# Patient Record
Sex: Female | Born: 1975 | Race: White | Hispanic: No | Marital: Single | Smoking: Current every day smoker
Health system: Southern US, Community
[De-identification: ages and names within clinical notes are randomized; demographics above are authoritative.]

## PROBLEM LIST (undated history)

## (undated) DIAGNOSIS — N390 Urinary tract infection, site not specified: Secondary | ICD-10-CM

## (undated) DIAGNOSIS — N3010 Interstitial cystitis (chronic) without hematuria: Secondary | ICD-10-CM

## (undated) DIAGNOSIS — N12 Tubulo-interstitial nephritis, not specified as acute or chronic: Secondary | ICD-10-CM

## (undated) DIAGNOSIS — N200 Calculus of kidney: Secondary | ICD-10-CM

## (undated) HISTORY — PX: APPENDECTOMY: SHX54

---

## 1997-09-17 ENCOUNTER — Emergency Department (HOSPITAL_COMMUNITY): Admission: EM | Admit: 1997-09-17 | Discharge: 1997-09-17 | Payer: Self-pay | Admitting: Emergency Medicine

## 1998-07-27 ENCOUNTER — Emergency Department (HOSPITAL_COMMUNITY): Admission: EM | Admit: 1998-07-27 | Discharge: 1998-07-27 | Payer: Self-pay | Admitting: Emergency Medicine

## 1998-07-28 ENCOUNTER — Emergency Department (HOSPITAL_COMMUNITY): Admission: EM | Admit: 1998-07-28 | Discharge: 1998-07-29 | Payer: Self-pay | Admitting: Emergency Medicine

## 1998-07-28 ENCOUNTER — Encounter: Payer: Self-pay | Admitting: Emergency Medicine

## 1998-07-29 ENCOUNTER — Encounter: Payer: Self-pay | Admitting: Emergency Medicine

## 1998-08-09 ENCOUNTER — Emergency Department (HOSPITAL_COMMUNITY): Admission: EM | Admit: 1998-08-09 | Discharge: 1998-08-09 | Payer: Self-pay | Admitting: Emergency Medicine

## 1998-09-28 ENCOUNTER — Ambulatory Visit (HOSPITAL_COMMUNITY): Admission: RE | Admit: 1998-09-28 | Discharge: 1998-09-28 | Payer: Self-pay | Admitting: *Deleted

## 1998-09-28 ENCOUNTER — Encounter: Payer: Self-pay | Admitting: *Deleted

## 1998-10-05 ENCOUNTER — Encounter: Admission: RE | Admit: 1998-10-05 | Discharge: 1998-10-05 | Payer: Self-pay | Admitting: Internal Medicine

## 1998-11-05 ENCOUNTER — Inpatient Hospital Stay (HOSPITAL_COMMUNITY): Admission: AD | Admit: 1998-11-05 | Discharge: 1998-11-05 | Payer: Self-pay | Admitting: *Deleted

## 1998-11-06 ENCOUNTER — Encounter: Payer: Self-pay | Admitting: *Deleted

## 1999-01-03 ENCOUNTER — Inpatient Hospital Stay (HOSPITAL_COMMUNITY): Admission: AD | Admit: 1999-01-03 | Discharge: 1999-01-03 | Payer: Self-pay | Admitting: *Deleted

## 1999-01-26 ENCOUNTER — Ambulatory Visit (HOSPITAL_COMMUNITY): Admission: RE | Admit: 1999-01-26 | Discharge: 1999-01-26 | Payer: Self-pay | Admitting: *Deleted

## 1999-01-26 ENCOUNTER — Encounter: Payer: Self-pay | Admitting: *Deleted

## 1999-04-04 ENCOUNTER — Inpatient Hospital Stay (HOSPITAL_COMMUNITY): Admission: AD | Admit: 1999-04-04 | Discharge: 1999-04-04 | Payer: Self-pay | Admitting: *Deleted

## 1999-05-20 ENCOUNTER — Inpatient Hospital Stay (HOSPITAL_COMMUNITY): Admission: AD | Admit: 1999-05-20 | Discharge: 1999-05-23 | Payer: Self-pay | Admitting: *Deleted

## 1999-05-20 ENCOUNTER — Encounter: Payer: Self-pay | Admitting: Obstetrics

## 1999-05-20 ENCOUNTER — Encounter (INDEPENDENT_AMBULATORY_CARE_PROVIDER_SITE_OTHER): Payer: Self-pay | Admitting: *Deleted

## 1999-05-22 ENCOUNTER — Encounter: Payer: Self-pay | Admitting: *Deleted

## 1999-05-22 ENCOUNTER — Encounter: Payer: Self-pay | Admitting: Obstetrics

## 2000-11-14 ENCOUNTER — Emergency Department (HOSPITAL_COMMUNITY): Admission: EM | Admit: 2000-11-14 | Discharge: 2000-11-14 | Payer: Self-pay | Admitting: Emergency Medicine

## 2000-11-14 ENCOUNTER — Encounter: Payer: Self-pay | Admitting: Emergency Medicine

## 2000-11-17 ENCOUNTER — Emergency Department (HOSPITAL_COMMUNITY): Admission: EM | Admit: 2000-11-17 | Discharge: 2000-11-17 | Payer: Self-pay | Admitting: Emergency Medicine

## 2000-11-17 ENCOUNTER — Encounter: Payer: Self-pay | Admitting: Emergency Medicine

## 2001-08-05 ENCOUNTER — Encounter: Payer: Self-pay | Admitting: Emergency Medicine

## 2001-08-05 ENCOUNTER — Emergency Department (HOSPITAL_COMMUNITY): Admission: EM | Admit: 2001-08-05 | Discharge: 2001-08-05 | Payer: Self-pay | Admitting: Emergency Medicine

## 2001-10-04 ENCOUNTER — Emergency Department (HOSPITAL_COMMUNITY): Admission: EM | Admit: 2001-10-04 | Discharge: 2001-10-04 | Payer: Self-pay | Admitting: Emergency Medicine

## 2001-11-26 ENCOUNTER — Encounter: Payer: Self-pay | Admitting: Emergency Medicine

## 2001-11-26 ENCOUNTER — Emergency Department (HOSPITAL_COMMUNITY): Admission: EM | Admit: 2001-11-26 | Discharge: 2001-11-26 | Payer: Self-pay

## 2002-03-07 ENCOUNTER — Inpatient Hospital Stay (HOSPITAL_COMMUNITY): Admission: AD | Admit: 2002-03-07 | Discharge: 2002-03-07 | Payer: Self-pay | Admitting: Family Medicine

## 2002-04-08 ENCOUNTER — Other Ambulatory Visit: Admission: RE | Admit: 2002-04-08 | Discharge: 2002-04-08 | Payer: Self-pay | Admitting: Obstetrics and Gynecology

## 2002-04-25 ENCOUNTER — Encounter: Payer: Self-pay | Admitting: Obstetrics and Gynecology

## 2002-04-25 ENCOUNTER — Inpatient Hospital Stay (HOSPITAL_COMMUNITY): Admission: AD | Admit: 2002-04-25 | Discharge: 2002-04-25 | Payer: Self-pay | Admitting: Obstetrics and Gynecology

## 2002-04-27 ENCOUNTER — Ambulatory Visit (HOSPITAL_COMMUNITY): Admission: AD | Admit: 2002-04-27 | Discharge: 2002-04-27 | Payer: Self-pay | Admitting: Obstetrics and Gynecology

## 2002-04-27 ENCOUNTER — Encounter (INDEPENDENT_AMBULATORY_CARE_PROVIDER_SITE_OTHER): Payer: Self-pay | Admitting: Specialist

## 2002-10-06 ENCOUNTER — Emergency Department (HOSPITAL_COMMUNITY): Admission: EM | Admit: 2002-10-06 | Discharge: 2002-10-06 | Payer: Self-pay | Admitting: Emergency Medicine

## 2002-11-19 ENCOUNTER — Emergency Department (HOSPITAL_COMMUNITY): Admission: EM | Admit: 2002-11-19 | Discharge: 2002-11-19 | Payer: Self-pay | Admitting: *Deleted

## 2002-11-19 ENCOUNTER — Encounter: Payer: Self-pay | Admitting: Emergency Medicine

## 2002-12-09 ENCOUNTER — Encounter: Admission: RE | Admit: 2002-12-09 | Discharge: 2002-12-09 | Payer: Self-pay | Admitting: Internal Medicine

## 2003-08-24 ENCOUNTER — Ambulatory Visit (HOSPITAL_COMMUNITY): Admission: RE | Admit: 2003-08-24 | Discharge: 2003-08-24 | Payer: Self-pay | Admitting: Obstetrics and Gynecology

## 2003-10-15 ENCOUNTER — Other Ambulatory Visit: Admission: RE | Admit: 2003-10-15 | Discharge: 2003-10-15 | Payer: Self-pay | Admitting: Obstetrics and Gynecology

## 2003-12-18 ENCOUNTER — Inpatient Hospital Stay (HOSPITAL_COMMUNITY): Admission: AD | Admit: 2003-12-18 | Discharge: 2003-12-18 | Payer: Self-pay | Admitting: Obstetrics and Gynecology

## 2004-03-03 ENCOUNTER — Inpatient Hospital Stay (HOSPITAL_COMMUNITY): Admission: AD | Admit: 2004-03-03 | Discharge: 2004-03-03 | Payer: Self-pay | Admitting: Obstetrics and Gynecology

## 2004-03-20 ENCOUNTER — Inpatient Hospital Stay (HOSPITAL_COMMUNITY): Admission: AD | Admit: 2004-03-20 | Discharge: 2004-03-20 | Payer: Self-pay | Admitting: Obstetrics and Gynecology

## 2004-04-07 ENCOUNTER — Inpatient Hospital Stay (HOSPITAL_COMMUNITY): Admission: RE | Admit: 2004-04-07 | Discharge: 2004-04-09 | Payer: Self-pay | Admitting: Obstetrics and Gynecology

## 2004-05-18 ENCOUNTER — Other Ambulatory Visit: Admission: RE | Admit: 2004-05-18 | Discharge: 2004-05-18 | Payer: Self-pay | Admitting: Obstetrics and Gynecology

## 2004-12-02 ENCOUNTER — Ambulatory Visit (HOSPITAL_COMMUNITY): Admission: RE | Admit: 2004-12-02 | Discharge: 2004-12-02 | Payer: Self-pay | Admitting: Obstetrics and Gynecology

## 2004-12-19 ENCOUNTER — Ambulatory Visit (HOSPITAL_COMMUNITY): Admission: RE | Admit: 2004-12-19 | Discharge: 2004-12-19 | Payer: Self-pay | Admitting: Obstetrics and Gynecology

## 2004-12-19 ENCOUNTER — Encounter (INDEPENDENT_AMBULATORY_CARE_PROVIDER_SITE_OTHER): Payer: Self-pay | Admitting: *Deleted

## 2006-06-12 ENCOUNTER — Emergency Department (HOSPITAL_COMMUNITY): Admission: EM | Admit: 2006-06-12 | Discharge: 2006-06-12 | Payer: Self-pay | Admitting: Emergency Medicine

## 2006-10-07 ENCOUNTER — Emergency Department (HOSPITAL_COMMUNITY): Admission: EM | Admit: 2006-10-07 | Discharge: 2006-10-07 | Payer: Self-pay | Admitting: Emergency Medicine

## 2007-06-28 ENCOUNTER — Emergency Department (HOSPITAL_COMMUNITY): Admission: EM | Admit: 2007-06-28 | Discharge: 2007-06-28 | Payer: Self-pay | Admitting: Emergency Medicine

## 2007-09-30 ENCOUNTER — Emergency Department (HOSPITAL_COMMUNITY): Admission: EM | Admit: 2007-09-30 | Discharge: 2007-09-30 | Payer: Self-pay | Admitting: Emergency Medicine

## 2008-03-16 ENCOUNTER — Emergency Department (HOSPITAL_COMMUNITY): Admission: EM | Admit: 2008-03-16 | Discharge: 2008-03-17 | Payer: Self-pay | Admitting: Emergency Medicine

## 2009-10-16 ENCOUNTER — Emergency Department (HOSPITAL_COMMUNITY): Admission: EM | Admit: 2009-10-16 | Discharge: 2009-10-16 | Payer: Self-pay | Admitting: Emergency Medicine

## 2010-10-07 NOTE — H&P (Signed)
Tammy Carlson, Tammy Carlson             ACCOUNT NO.:  [id number]   MEDICAL RECORD NO.:  [id number]           PATIENT TYPE:   LOCATION:                                 FACILITY:   PHYSICIAN:  Rudy Jew. Ashley Royalty, M.D.     DATE OF BIRTH:   DATE OF ADMISSION:  12/19/2004  DATE OF DISCHARGE:                                HISTORY & PHYSICAL   This is a 35 year old gravida 4, para 1-2-1-2 (had a child born with  leukemia who died at 71 months of age).  She presented to me December 01, 2004  complaining of right lower quadrant and back pain.  She states she went to  [facility] the day prior to presenting to my office and was evaluated for  same.  A urinalysis at that location was negative as well as a CBC.  She  denies having associated fever, nausea, or vomiting.  She has a Mirena IUD  in place which she desires to have removed.  She also states a desire for  attempt at permanent surgical sterilization.  She is status post  appendectomy many years ago.  She had an ultrasound performed December 02, 2004  which was negative.  Her pain persists and is located mostly on the right  side.  She is requiring narcotic analgesics in order to function normally  and requests surgical intervention for same.  Hence, she requests  diagnostic/operative laparoscopy with laparoscopic tubal sterilization  procedure as well.   MEDICATIONS:  None.   PAST MEDICAL HISTORY:   MEDICAL:  Negative.   SURGICAL:  1.  Cesarean section x2.  2.  Appendectomy.  3.  Orthopedic surgery on foot.   ALLERGIES:  None.   FAMILY HISTORY:  Positive for hypertension and diabetes.   SOCIAL HISTORY:  Patient smokes approximately one-half pack of cigarettes  per day.  She denies significant use of alcohol.   REVIEW OF SYSTEMS:  Noncontributory.   PHYSICAL EXAMINATION:  GENERAL:  Well-developed, well-nourished, pleasant  female in no acute distress.  VITAL SIGNS:  Afebrile.  Vital signs stable.  SKIN:  Warm and dry without lesions.  LYMPH:  There is no supraclavicular, cervical, or inguinal adenopathy.  CHEST:  Lungs are clear.  CARDIAC:  Regular rate and rhythm.  ABDOMEN:  Soft and nontender without masses or organomegaly.  Bowel sounds  are active.  PELVIC:  External genitalia within normal limits.  Vagina and cervix are  without gross lesions.  Bimanual examination reveals uterus to be  approximately 8 x 4 x 4 cm.  No adnexal masses are palpable.   IMPRESSION:  1.  Pelvic pain.  Rule out adhesions, endometriosis, etc.  2.  Desire for attempt at permanent surgical sterilization.  3.  Status post appendectomy.   PLAN:  Diagnostic/operative laparoscopy, laparoscopic tubal sterilization  procedure.  Risks, benefits, complications, and alternatives fully discussed  with the patient.  Permanency and failure rate of various tubal  sterilization procedures discussed and accepted.  Possibility of mini  laparotomy with partial salpingectomy discussed and accepted.  Possible use  of the laser discussed and accepted.  Possible  need for unilateral salpingo-  oophorectomy discussed and accepted.  Possible need for exploratory  laparotomy discussed and accepted.  Questions invited and answered.  Patient  also requests removal of the IUD in the same setting.   Please note a portion of the evaluation for this document was performed in  the office setting.       JAM/MEDQ  D:  12/19/2004  T:  12/19/2004  Job:  [id number]

## 2010-10-07 NOTE — Op Note (Signed)
Tammy Carlson, Tammy Carlson             ACCOUNT NO.:  [id number]   MEDICAL RECORD NO.:  [id number]          PATIENT TYPE:  AMB   LOCATION:  SDC                           [facility]   PHYSICIAN:  James A. Ashley Royalty, M.D.DATE OF BIRTH:  [birth year]   DATE OF PROCEDURE:  12/19/2004  DATE OF DISCHARGE:                                 OPERATIVE REPORT   PREOPERATIVE DIAGNOSIS:  1.  Pelvic pain - rule out adhesions, endometriosis.  2.  Desire for attempted permanent surgical sterilization.   POSTOPERATIVE DIAGNOSIS:  1.  Pelvic pain - rule out adhesions, endometriosis, with suspected      endometriosis. Path pending.  2.  Pelvic adhesions.   PROCEDURE:  1.  Diagnostic/operative laparoscopy.  2.  Bilateral tubal sterilization procedure (Falope rings)  3.  Lysis of adhesions.  4.  Biopsy of the anterior cul-de-sac.  5.  Fulguration of endometriosis.   SURGEON:  Rudy Jew. Ashley Royalty, M.D.   ANESTHESIA:  General.   ESTIMATED BLOOD LOSS:  Less than 25 mL.   COMPLICATIONS:  None.   PACKS AND DRAINS:  None.   PROCEDURE:  The patient was taken to the operating room and placed in the  dorsosupine position. After general anesthesia was administered. She was  placed in the lithotomy position, prepped and draped in usual manner for  abdominal vaginal surgery. Posterior weighted retractor was placed per  vagina. Anterior lip of cervix grasped with single-tooth tenaculum. Jarcho  uterine manipulator was placed per cervix. The IUD string was not  immediately visualized. It was determined it would be removed later in the  office. Jarcho uterine manipulator was placed per cervix and held in place  with tenaculum. The bladder was drained with a red rubber catheter. Next a  1.5 cm infraumbilical incision was made in the longitudinal plane. Veress  needle was inserted into the abdominal cavity. Its location was verified by  instillation saline in hanging drop techniques. 3 liters of CO2 were  instilled at 1 liter per minute to create a good pneumoperitoneum. Next a  size 10/11 disposable laparoscopic trocar was placed in the abdominal  cavity. Its location was verified by placement laparoscope. There were no  signs of trauma. Pneumoperitoneum was maintained throughout with CO2. Next a  5 mm suprapubic port was placed through the patient's old laparotomy  incision in the midline. Correct visualization and transillumination  techniques were employed. The pelvis was then thoroughly inspected.  Immediately upon visualizing the pelvis an omental adhesion was noted from  the omentum to the anterior abdominal wall just the left of the midline and  approximately 1/3 of the way from the umbilicus to the symphysis pubis.  Appropriate photos were obtained. Bipolar cautery was employed to coagulate  and then scissors were used to lyse the adhesions successfully. Next the  pelvis was inspected more thoroughly. The uterus was normal size, shape and  contour without evidence of any fibroids or endometriosis. The left and  right fallopian tubes were normal size, shape, contour and length with  luxuriant fimbria. The right ovary slightly enlarged and had some pigmented  lesions consistent  with but not diagnostic of endometriosis. Left ovary had  similar pigmented lesions consistent with but not diagnostic of  endometriosis. It was normal size, shape and contour. There were several  pigmented lesions on the parietal peritoneum consistent with endometriosis  one in particular in the anterior cul-de-sac was chosen for biopsy and was  successfully biopsied and submitted to pathology for histologic studies.  Attempt was made to biopsy the pigmented lesion of the left ovary.  However,  it appeared after the biopsy was completed no tissue could really be  successfully identified and submitted for histologic studies. Additional  adhesions were noted from the right colon to the right pelvic sidewall and   also up higher in the right mid quadrant to the right pelvic sidewall. These  were filmy and were easily lysed without difficulty. The remaining areas of  endometriosis were fulgurated with the bipolar cautery.   Attention was then turned to tubal sterilization procedure. The right  fallopian tube was grasped, traced to its fimbriated end. Avascular area was  chosen. The distal isthmic to proximal ampullary region for Falope ring  placement. A Falope ring was applied without difficulty. Excellent blanching  of tissue was noted. An excellent knuckle of tube was noted to be contained  within the ring. Appropriate photo was obtained. Next the left fallopian  tube was grasped, traced to its fimbriated end. Avascular area in the distal  isthmic portion was chosen for ring placement. Falope ring was applied  without difficulty. Excellent knuckle of tube was noted to be contained  within the ring. Excellent blanching of tissue was noted. Should be  mentioned that 8 mm trocar was placed in the previous midline position in  order to accomplish Falope ring placement. In addition, a 5 mm suprapubic  port was placed in the left lower quadrant using transillumination and  direct visualization techniques in order to accomplish the biopsies.   At this point the patient was felt to have benefited maximally from the  surgical procedure. The abdominal instruments were removed and  pneumoperitoneum evacuated. Fascial defects were closed with 0 Vicryl in a  interrupted fashion. The superior skin incision was closed with 3-0 Monocryl  in a subcuticular fashion. The inferior skin incisions were closed with  Dermabond. The vaginal instruments were removed. Hemostasis noted. The  procedure terminated.   The patient tolerated the procedure extremely well and was returned to the  recovery room in good condition.       JAM/MEDQ  D:  12/19/2004  T:  12/19/2004  Job:  [id number]

## 2010-10-07 NOTE — H&P (Signed)
[name], Tammy Carlson             ACCOUNT NO.:  [id number]   MEDICAL RECORD NO.:  [id number]          PATIENT TYPE:  INP   LOCATION:  NA                            [facility]   PHYSICIAN:  Tammy Carlson, M.D.DATE OF BIRTH:  1975/10/27   DATE OF ADMISSION:  04/07/2004  DATE OF DISCHARGE:                                HISTORY & PHYSICAL   This is a 35 year old, gravida 5, para 1-1-2-1, [facility] April 16, 2004,  approximately [redacted] weeks gestation.  Prenatal care is complicated by a history  of previous cesarean section x2 as well as smoking.  She requests repeat  cesarean section.   MEDICATIONS:  Vitamins.   PAST MEDICAL HISTORY:  Mitral valve prolapse.   PAST SURGICAL HISTORY:  As above plus foot surgery, appendectomy.   ALLERGIES:  None.   FAMILY HISTORY:  Noncontributory.   SOCIAL HISTORY:  Noncontributory.   PHYSICAL EXAMINATION:  GENERAL:  Well-developed, well-nourished, pleasant  female in no acute distress.  VITAL SIGNS:  Afebrile, vital signs stable.  SKIN:  Warm and dry without lesions.  LYMPH:  There is no supraclavicular, cervical or inguinal adenopathy.  HEENT:  Normocephalic.  NECK:  Supple without thyromegaly.  CHEST:  Lungs are clear.  CARDIAC:  Regular rate and rhythm without murmur, gallop or rub.  BREAST:  Deferred.  ABDOMEN:  Gravid with a term fundal height.  Fetal heart tones are  auscultated with a Doppler.  MUSCULOSKELETAL:  No CVA tenderness.  PELVIC:  Deferred.   IMPRESSION:  1.  Intrauterine pregnancy at approximately [redacted] weeks gestation.  2.  Previous cesarean section.  3.  Smoker.   PLAN:  Repeat cesarean section. The risks, benefits, complications and  alternatives were discussed with the patient. She states she understands and  accepts.  Questions were invited and answered.      JAM/MEDQ  D:  04/06/2004  T:  04/07/2004  Job:  [id number]

## 2010-10-07 NOTE — H&P (Signed)
   NAME:  Tammy Carlson, Tammy Carlson                       ACCOUNT NO.:  [id number]   MEDICAL RECORD NO.:  [id number]                   PATIENT TYPE:  AMB   LOCATION:  SDC                                  [facility]   PHYSICIAN:  Ronda Fairly. Galen Daft, M.D.              DATE OF BIRTH:  April 29, 1976   DATE OF ADMISSION:  04/27/2002  DATE OF DISCHARGE:                                HISTORY & PHYSICAL   CHIEF COMPLAINT:  Abdominal pain.   HISTORY OF PRESENT ILLNESS:  The patient is a 35 year old Gravida III, Para  II, who had two prior Cesarean sections for abruption of placenta. On this  pregnancy, she presented first on Friday evening to the emergency room at  [facility]. At that time, there was a symptom of spotting. The  ultrasound that was performed showed evidence of a non-viable 11+ week  gestation. There was no cardiac activity nor fetal movement. This was  confirmed by myself also in the room during the examination. The patient had  a closed cervix at that point. She was apprised of the diagnosis. She was  provided some Diazepam for anxiolytic and instructed to follow-up for D&E  after the weekend or if symptoms began with bleeding or cramping increasing,  she should call the office earlier for evaluation or present to the  emergency room. She did present today to the emergency room with much  heavier lower abdominal pain. The bleeding also had increased. The patient  had no other change in her condition. No fever, chills or other symptoms.   PAST MEDICAL HISTORY:  Two Cesarean sections. Otherwise, unremarkable.   ALLERGIES:  No known drug allergies.   MEDICATIONS:  Diazepam and prenatal vitamins.   PHYSICAL EXAMINATION:  GENERAL: Alert and oriented.  VITAL SIGNS: All were stable and she was afebrile.  ABDOMEN: Soft and nontender to palpation. However, she was having some  abdominal contraction-like discomfort.  LUNGS: Examination was clear.  CARDIAC: Regular rate and rhythm.  GU: Pelvic examination revealed active bleeding and that the cervix was  dilated to fingertip.   ASSESSMENT:  Inevitable abortion.   PLAN:  For suction dilatation and evacuation. The patient understood the  procedure and this was scheduled for April 27, 2002.                                               Ronda Fairly. Galen Daft, M.D.    NJT/MEDQ  D:  04/27/2002  T:  04/27/2002  Job:  [id number]

## 2010-10-07 NOTE — Op Note (Signed)
   NAME:  Tammy Carlson, Tammy Carlson                       ACCOUNT NO.:  [id number]   MEDICAL RECORD NO.:  [id number]                   PATIENT TYPE:  AMB   LOCATION:  SDC                                  [facility]   PHYSICIAN:  Ronda Fairly. Galen Daft, M.D.              DATE OF BIRTH:  July 20, 1975   DATE OF PROCEDURE:  04/27/2002  DATE OF DISCHARGE:                                 OPERATIVE REPORT   PREOPERATIVE DIAGNOSIS:  Inevitable abortion.   POSTOPERATIVE DIAGNOSIS:  Inevitable abortion.   PROCEDURE:  Suction, dilatation and evacuation.   SURGEON:  Ronda Fairly. Galen Daft, M.D.   ANESTHESIA:  MAC with local, Lidocaine.   COMPLICATIONS:  None.   ESTIMATED BLOOD LOSS:  Less than 10 cc.   SPECIMENS:  Sent to lab products of conception.   INDICATIONS:  The patient was identified as [name].   DESCRIPTION OF PROCEDURE:  I obtained informed consent prior to bringing her  to the operating room including the possibility of incomplete procedure,  injury to internal organs, infection and bleeding.  The patient was aware of  the procedure, its risks and alternatives and also the benefits.  She was  having acute contraction-like discomfort at the time of the onset of the  procedure.  She had anesthesia which brought a level of complete block with  the cervix and the 15 cc of 1% lidocaine.  The cervix was dilated already  about 0.5 cm dilation.  It was dilated to accept a 12 mm suction curet  without difficulty.  The 12 mm suction curet was placed into the uterine  fundus and products of conception were removed.  The body and thorax,  abdomen and extremities all four were accounted for as was the head of the  embryo.  All of these parts were present and accounted for.  The placenta  was also removed in fragments in this case and sent off to pathology. The  patient received prophylactic antibiotics at the beginning of the procedure.  There were no complications from it.  She left the operating  room without  any evidence of bleeding.  Examination under anesthesia revealed the uterus  to be approximately 12 weeks' size prior to surgery and 8-10 week's size  afterward.  The instrument, sponge and needle counts were correct at the end  of the case.                                               Ronda Fairly. Galen Daft, M.D.    NJT/MEDQ  D:  04/27/2002  T:  04/27/2002  Job:  [id number]

## 2010-10-07 NOTE — Op Note (Signed)
[name], [name]             ACCOUNT NO.:  [id number]   MEDICAL RECORD NO.:  [id number]          PATIENT TYPE:  INP   LOCATION:  9119                          [facility]   PHYSICIAN:  James A. Ashley Royalty, M.D.DATE OF BIRTH:  Feb 25, 1976   DATE OF PROCEDURE:  04/07/2004  DATE OF DISCHARGE:                                 OPERATIVE REPORT   PREOPERATIVE DIAGNOSES:  1.  Intrauterine pregnancy at [redacted] weeks gestation.  2.  Previous cesarean section.   POSTOPERATIVE DIAGNOSES:  1.  Intrauterine pregnancy at [redacted] weeks gestation.  2.  Previous cesarean section.   PROCEDURE:  Repeat low transverse cesarean section.   SURGEON:  Rudy Jew. Ashley Royalty, M.D.   ASSISTANT:  Bing Neighbors. Delcambre, MD   ANESTHESIA:  Spinal.   FINDINGS:  8 pound 12 ounce female, Apgar's 9 at 1 minute, 9 at 5 minutes sent  to the newborn nursery.   ESTIMATED BLOOD LOSS:  500 mL.   COMPLICATIONS:  None.   PACKS/DRAINS:  Foley.   COUNTS:  Sponge, needle and instrument counts reported as correct x2.   DESCRIPTION OF PROCEDURE:  The patient was taken to the operating room and  placed in the sitting position. Spinal anesthetic was administered and she  was placed in the dorsal supine position. She was prepped and draped in the  usual manner for abdominal surgery.  A Foley catheter was placed.  A  Pfannenstiel incision was made through the patient's old Pfannenstiel scar.  The subcutaneous tissues were sharply and bluntly dissected down to the  fascia which was nicked with a knife and incised transversely with Mayo  scissors. The underlying rectus muscles were separated from the overlying  fascia using sharp and blunt dissection.  The rectus muscles were separated  in the midline, exposing the peritoneum which was elevated with hemostats  and entered atraumatically with Metzenbaum scissors. The incision was  extended longitudinally. The uterus was identified and a bladder flap  created by incising the anterior  uterine serosa.  The bladder was sharply  and bluntly dissected inferiorly and held in place with a bladder blade. The  uterus was then entered through a low transverse incision using sharp and  blunt dissection. The fluid was clear. The infant was delivered from the  vertex presentation in an atraumatic manner. The infant was suctioned. The  cord was doubly clamped, cut and the infant given immediately to the waiting  pediatric's team. Cord blood was obtained and the placenta removed.  The  uterus was exteriorized. The uterus was then closed in two running layers  with #1 Vicryl. The first was a running locking layer. The second was a  running, intermittently locking, and imbricating layer. Hemostasis was  noted. The uterus, tubes and ovaries were returned to the abdominal cavity.  Copious irrigation was accomplished.  Two or three interrupted sutures were  required at the inferior margin of the rectus muscles to obtain hemostasis.  Hemostasis was noted. The peritoneum was then closed with 3-0 chromic in a  running fashion. The fascia was closed with #0 Vicryl in a running fashion.  The  skin was closed with staples.   The patient tolerated the procedure extremely well and was returned to the  recovery room in good condition.  At the conclusion of the procedure, the  urine was clear and copious.      JAM/MEDQ  D:  04/07/2004  T:  04/08/2004  Job:  [id number]

## 2010-10-07 NOTE — Discharge Summary (Signed)
NAMELOGAN, Tammy Carlson             ACCOUNT NO.:  [id number]   MEDICAL RECORD NO.:  [id number]          PATIENT TYPE:  INP   LOCATION:  9119                          [facility]   PHYSICIAN:  James A. Ashley Royalty, M.D.DATE OF BIRTH:  1975/10/23   DATE OF ADMISSION:  04/07/2004  DATE OF DISCHARGE:  04/09/2004                                 DISCHARGE SUMMARY   DISCHARGE DIAGNOSES:  1.  Intrauterine pregnancy at [redacted] weeks gestation.  2.  Previous cesarean section.  3.  Smoker.  4.  Term birth living child.   OPERATIONS AND SPECIAL PROCEDURES:  Repeat low transverse cesarean section.   CONSULTATIONS:  None.   DISCHARGE MEDICATIONS:  Percocet.   HISTORY AND PHYSICAL:  This is a 35 year old gravida 5 para 1-1-2-1 with EDC  April 16, 2004 at [redacted] weeks gestation.  She was admitted for repeat  cesarean section.   HOSPITAL COURSE:  The patient was admitted to [facility]  [place].  Admission laboratory studies were drawn.  On April 07, 2004  she was taken to the operating room and underwent repeat low transverse  cesarean section.  The procedure yielded an 8-ounce 12-ounce female, Apgars 8  at one minute and 9 at five minutes, sent to the newborn nursery.  The  patient's postpartum course was benign.  She was discharged on postpartum  day #2 afebrile and in satisfactory condition.  She was discharged on that  day at her request.   DISPOSITION:  The patient is to return to [facility] and Obstetrics  in 4-6 weeks for postpartum evaluation.      JAM/MEDQ  D:  05/12/2004  T:  05/12/2004  Job:  [id number]

## 2010-10-07 NOTE — H&P (Signed)
[facility]  Patient:    Tammy Carlson                     MRN: [id number] Adm. Date:  [id number] Attending:  Deniece Ree                         History and Physical  HISTORY OF PRESENT ILLNESS:   Patient is a 35 year old gravida 2, para 0-1-0-0, a patient of Dr. Sherilyn Dacosta, whose EDC is June 11, 1999; she is exactly 7 weeks.  The patient presented tonight with a history of sudden onset of right back pain since 4 p.m. today.  The pain was to the right mid-lower back and it was colicky in nature.  When the patient was seen in the emergency room, urinalysis was negative.  It was not possible to get an adequate tracing of the baby because the patient was very agitated and could not stay in one position.  The fetal heart as 140.  Ultrasound was performed of the upper abdomen and this showed a 6-cm right ovarian cyst; otherwise, it was negative for kidney stone or gallstones.  The patient received 75 mg of Demerol and this did not ease her pain.  She denied any nausea or vomiting.  With her previous pregnancy, the patient had an abruption t 32 weeks.  PHYSICAL EXAMINATION:  GENERAL:                      Physical exam revealed a white female in acute distress.  HEENT:                        Negative.  LUNGS:                        Clear.  HEART:                        Regular rhythm.  No murmurs.  No gallops.  ABDOMEN:                      Term-size uterus.  There was some severe tenderness on the right, below the costal margin.  The pain seemed to be colicky in nature.  PELVIC:                       On examination, her uterus was not soft, but not rigid, and the patient complained that her abdomen was sore.  The cervix was closed.  Vertex was at a -1 station.  IMPRESSION:                   Possible hidden early abruption versus torsion of the right adnexa versus kidney stone.  PLAN:                         It was  decided that the patient would be delivered by C-section to rule out the above. DD:  05/20/99 TD:  05/21/99 Job: [id number]

## 2010-10-07 NOTE — Op Note (Signed)
[facility]  Patient:    Tammy Carlson                     MRN: [id number] Proc. Date: 05/20/99 Adm. Date:  [id number] Attending:  Deniece Ree                           Operative Report  PREOPERATIVE DIAGNOSIS:       Possible hidden early abruption versus torsion of the right adnexa.  POSTOPERATIVE DIAGNOSIS:      None of the above; cause of patients pain unknown  until further investigation.  OPERATION:  SURGEON:                      Kathreen Cosier, M.D.  ANESTHESIA:                   Spinal.  DESCRIPTION OF PROCEDURE:     Patient was placed on the operating table in a supine position after the spinal was administered.  Abdomen was prepped and draped. Bladder was emptied with a Foley catheter.  Transverse suprapubic incision was ade through the old scar and carried down to the rectus fascia, the fascia cleaned nd incised the length of the incision, rectus muscles retracted laterally and peritoneum incised longitudinally.  A transverse incision was made in the visceroperitoneum above the bladder and the bladder mobilized inferiorly.  A transverse lower uterine incision was made.  There was a large amount of clear amniotic fluid noted.  The patient was delivered from the LOA position of a female, Apgars 7/8, weighing 7 pounds.  The team was in attendance.  The placenta was posterior, intact, and removed manually and sent to pathology.  There was no sign of any abruption.  Uterine cavity was cleaned with dry laps.  Uterine incision as closed with interlocking suture of #1 chromic including myometrium and endometrium. The myometrium was closed in one layer.  Bladder flap was reattached with 2-0 chromic.  Uterus was well-contracted.  The left ovary was normal.  There was a -cm clear-walled right ovarian cyst and there was no sign of any torsion of the adnexa so nothing was done.  Bowel appeared to be normal.  The lap and sponge  count was correct.  Abdomen was closed in layers; the peritoneum with a continuous suture of 0 chromic, fascia with continuous suture of 0 Dexon and the skin closed with subcuticular suture of 3-0 plain.  Blood loss:  500 cc.  Patient tolerated the procedure well and taken to recovery room in good condition. DD:  05/20/99 TD:  05/23/99 Job: [id number]

## 2014-05-14 ENCOUNTER — Encounter (HOSPITAL_COMMUNITY): Payer: Self-pay | Admitting: Cardiology

## 2014-05-14 ENCOUNTER — Emergency Department (HOSPITAL_COMMUNITY)
Admission: EM | Admit: 2014-05-14 | Discharge: 2014-05-14 | Disposition: A | Discharge status: Home / Self Care | Payer: Medicaid Other | Attending: Emergency Medicine | Admitting: Emergency Medicine

## 2014-05-14 ENCOUNTER — Emergency Department (HOSPITAL_COMMUNITY): Payer: Medicaid Other

## 2014-05-14 DIAGNOSIS — Z720 Tobacco use: Secondary | ICD-10-CM | POA: Diagnosis not present

## 2014-05-14 DIAGNOSIS — Z87442 Personal history of urinary calculi: Secondary | ICD-10-CM | POA: Insufficient documentation

## 2014-05-14 DIAGNOSIS — R531 Weakness: Secondary | ICD-10-CM | POA: Insufficient documentation

## 2014-05-14 DIAGNOSIS — R112 Nausea with vomiting, unspecified: Secondary | ICD-10-CM | POA: Diagnosis not present

## 2014-05-14 DIAGNOSIS — R109 Unspecified abdominal pain: Secondary | ICD-10-CM | POA: Diagnosis present

## 2014-05-14 DIAGNOSIS — R1030 Lower abdominal pain, unspecified: Secondary | ICD-10-CM

## 2014-05-14 DIAGNOSIS — Z3202 Encounter for pregnancy test, result negative: Secondary | ICD-10-CM | POA: Insufficient documentation

## 2014-05-14 DIAGNOSIS — R51 Headache: Secondary | ICD-10-CM | POA: Insufficient documentation

## 2014-05-14 DIAGNOSIS — R42 Dizziness and giddiness: Secondary | ICD-10-CM | POA: Insufficient documentation

## 2014-05-14 DIAGNOSIS — R197 Diarrhea, unspecified: Secondary | ICD-10-CM | POA: Diagnosis not present

## 2014-05-14 HISTORY — DX: Calculus of kidney: N20.0

## 2014-05-14 LAB — CBC WITH DIFFERENTIAL/PLATELET
BASOS ABS: 0 10*3/uL (ref 0.0–0.1)
BASOS PCT: 0 % (ref 0–1)
EOS ABS: 0 10*3/uL (ref 0.0–0.7)
Eosinophils Relative: 0 % (ref 0–5)
HCT: 51.3 % — ABNORMAL HIGH (ref 36.0–46.0)
Hemoglobin: 17.5 g/dL — ABNORMAL HIGH (ref 12.0–15.0)
LYMPHS ABS: 0.7 10*3/uL (ref 0.7–4.0)
Lymphocytes Relative: 5 % — ABNORMAL LOW (ref 12–46)
MCH: 29.2 pg (ref 26.0–34.0)
MCHC: 34.1 g/dL (ref 30.0–36.0)
MCV: 85.6 fL (ref 78.0–100.0)
MONO ABS: 0.4 10*3/uL (ref 0.1–1.0)
Monocytes Relative: 3 % (ref 3–12)
NEUTROS ABS: 13.3 10*3/uL — AB (ref 1.7–7.7)
NEUTROS PCT: 92 % — AB (ref 43–77)
Platelets: 234 10*3/uL (ref 150–400)
RBC: 5.99 MIL/uL — ABNORMAL HIGH (ref 3.87–5.11)
RDW: 12.7 % (ref 11.5–15.5)
WBC: 14.3 10*3/uL — AB (ref 4.0–10.5)

## 2014-05-14 LAB — COMPREHENSIVE METABOLIC PANEL
ALBUMIN: 4.6 g/dL (ref 3.5–5.2)
ALT: 30 U/L (ref 0–35)
ANION GAP: 12 (ref 5–15)
AST: 30 U/L (ref 0–37)
Alkaline Phosphatase: 55 U/L (ref 39–117)
BUN: 15 mg/dL (ref 6–23)
CHLORIDE: 104 meq/L (ref 96–112)
CO2: 21 mmol/L (ref 19–32)
CREATININE: 0.93 mg/dL (ref 0.50–1.10)
Calcium: 9.9 mg/dL (ref 8.4–10.5)
GFR calc Af Amer: 90 mL/min — ABNORMAL LOW (ref >90)
GFR, EST NON AFRICAN AMERICAN: 78 mL/min — AB (ref >90)
Glucose, Bld: 185 mg/dL — ABNORMAL HIGH (ref 70–99)
POTASSIUM: 4.5 mmol/L (ref 3.5–5.1)
SODIUM: 137 mmol/L (ref 135–145)
TOTAL PROTEIN: 8.3 g/dL (ref 6.0–8.3)
Total Bilirubin: 0.5 mg/dL (ref 0.3–1.2)

## 2014-05-14 LAB — URINALYSIS, ROUTINE W REFLEX MICROSCOPIC
GLUCOSE, UA: NEGATIVE mg/dL
Hgb urine dipstick: NEGATIVE
KETONES UR: 40 mg/dL — AB
Leukocytes, UA: NEGATIVE
Nitrite: NEGATIVE
PROTEIN: 30 mg/dL — AB
SPECIFIC GRAVITY, URINE: 1.024 (ref 1.005–1.030)
UROBILINOGEN UA: 0.2 mg/dL (ref 0.0–1.0)
pH: 6 (ref 5.0–8.0)

## 2014-05-14 LAB — URINE MICROSCOPIC-ADD ON

## 2014-05-14 LAB — I-STAT CG4 LACTIC ACID, ED: Lactic Acid, Venous: 1.49 mmol/L (ref 0.5–2.2)

## 2014-05-14 LAB — LIPASE, BLOOD: Lipase: 24 U/L (ref 11–59)

## 2014-05-14 LAB — POC URINE PREG, ED: Preg Test, Ur: NEGATIVE

## 2014-05-14 LAB — POC OCCULT BLOOD, ED: FECAL OCCULT BLD: POSITIVE — AB

## 2014-05-14 MED ORDER — SODIUM CHLORIDE 0.9 % IV BOLUS (SEPSIS)
1000.0000 mL | Freq: Once | INTRAVENOUS | Status: AC
  Administered 2014-05-14: 1000 mL via INTRAVENOUS

## 2014-05-14 MED ORDER — IOHEXOL 300 MG/ML  SOLN
25.0000 mL | Freq: Once | INTRAMUSCULAR | Status: AC | PRN
  Administered 2014-05-14: 25 mL via ORAL

## 2014-05-14 MED ORDER — ONDANSETRON 8 MG PO TBDP: 8.0000 mg | ORAL_TABLET | Freq: Three times a day (TID) | ORAL | Status: DC | PRN

## 2014-05-14 MED ORDER — HYDROMORPHONE HCL 1 MG/ML IJ SOLN
0.5000 mg | Freq: Once | INTRAMUSCULAR | Status: AC
  Administered 2014-05-14: 0.5 mg via INTRAVENOUS
  Filled 2014-05-14: qty 1

## 2014-05-14 MED ORDER — IOHEXOL 300 MG/ML  SOLN
100.0000 mL | Freq: Once | INTRAMUSCULAR | Status: AC | PRN
  Administered 2014-05-14: 100 mL via INTRAVENOUS

## 2014-05-14 MED ORDER — FENTANYL CITRATE 0.05 MG/ML IJ SOLN
50.0000 ug | Freq: Once | INTRAMUSCULAR | Status: AC
Start: 2014-05-14 — End: 2014-05-14
  Administered 2014-05-14: 50 ug via INTRAVENOUS
  Filled 2014-05-14: qty 2

## 2014-05-14 MED ORDER — ONDANSETRON HCL 4 MG/2ML IJ SOLN
4.0000 mg | Freq: Once | INTRAMUSCULAR | Status: AC
  Administered 2014-05-14: 4 mg via INTRAVENOUS
  Filled 2014-05-14: qty 2

## 2014-05-14 MED ORDER — HYDROCODONE-ACETAMINOPHEN 5-325 MG PO TABS: 1.0000 | ORAL_TABLET | Freq: Four times a day (QID) | ORAL | Status: DC | PRN

## 2014-05-14 MED ORDER — HYDROMORPHONE HCL 1 MG/ML IJ SOLN
0.5000 mg | Freq: Once | INTRAMUSCULAR | Status: AC
Start: 2014-05-14 — End: 2014-05-14
  Administered 2014-05-14: 0.5 mg via INTRAVENOUS
  Filled 2014-05-14: qty 1

## 2014-05-14 NOTE — Discharge Instructions (Signed)
norco for severe pain. Zofran for nausea. Drink plenty of fluids. Follow up with primary care doctor if not improving. Return if worsening symptoms.   Viral Gastroenteritis Viral gastroenteritis is also known as stomach flu. This condition affects the stomach and intestinal tract. It can cause sudden diarrhea and vomiting. The illness typically lasts 3 to 8 days. Most people develop an immune response that eventually gets rid of the virus. While this natural response develops, the virus can make you quite ill. CAUSES  Many different viruses can cause gastroenteritis, such as rotavirus or noroviruses. You can catch one of these viruses by consuming contaminated food or water. You may also catch a virus by sharing utensils or other personal items with an infected person or by touching a contaminated surface. SYMPTOMS  The most common symptoms are diarrhea and vomiting. These problems can cause a severe loss of body fluids (dehydration) and a body salt (electrolyte) imbalance. Other symptoms may include:  Fever.  Headache.  Fatigue.  Abdominal pain. DIAGNOSIS  Your caregiver can usually diagnose viral gastroenteritis based on your symptoms and a physical exam. A stool sample may also be taken to test for the presence of viruses or other infections. TREATMENT  This illness typically goes away on its own. Treatments are aimed at rehydration. The most serious cases of viral gastroenteritis involve vomiting so severely that you are not able to keep fluids down. In these cases, fluids must be given through an intravenous line (IV). HOME CARE INSTRUCTIONS   Drink enough fluids to keep your urine clear or pale yellow. Drink small amounts of fluids frequently and increase the amounts as tolerated.  Ask your caregiver for specific rehydration instructions.  Avoid:  Foods high in sugar.  Alcohol.  Carbonated drinks.  Tobacco.  Juice.  Caffeine drinks.  Extremely hot or cold  fluids.  Fatty, greasy foods.  Too much intake of anything at one time.  Dairy products until 24 to 48 hours after diarrhea stops.  You may consume probiotics. Probiotics are active cultures of beneficial bacteria. They may lessen the amount and number of diarrheal stools in adults. Probiotics can be found in yogurt with active cultures and in supplements.  Wash your hands well to avoid spreading the virus.  Only take over-the-counter or prescription medicines for pain, discomfort, or fever as directed by your caregiver. Do not give aspirin to children. Antidiarrheal medicines are not recommended.  Ask your caregiver if you should continue to take your regular prescribed and over-the-counter medicines.  Keep all follow-up appointments as directed by your caregiver. SEEK IMMEDIATE MEDICAL CARE IF:   You are unable to keep fluids down.  You do not urinate at least once every 6 to 8 hours.  You develop shortness of breath.  You notice blood in your stool or vomit. This may look like coffee grounds.  You have abdominal pain that increases or is concentrated in one small area (localized).  You have persistent vomiting or diarrhea.  You have a fever.  The patient is a child younger than 3 months, and he or she has a fever.  The patient is a child older than 3 months, and he or she has a fever and persistent symptoms.  The patient is a child older than 3 months, and he or she has a fever and symptoms suddenly get worse.  The patient is a baby, and he or she has no tears when crying. MAKE SURE YOU:   Understand these instructions.  Will watch  your condition.  Will get help right away if you are not doing well or get worse. Document Released: 05/08/2005 Document Revised: 07/31/2011 Document Reviewed: 02/22/2011 [facility] Patient Information 2015 [place], [place]. This information is not intended to replace advice given to you by your health care provider. Make sure you discuss  any questions you have with your health care provider.   Norovirus Infection Norovirus illness is caused by a viral infection. The term norovirus refers to a group of viruses. Any of those viruses can cause norovirus illness. This illness is often referred to by other names such as viral gastroenteritis, stomach flu, and food poisoning. Anyone can get a norovirus infection. People can have the illness multiple times during their lifetime. CAUSES  Norovirus is found in the stool or vomit of infected people. It is easily spread from person to person (contagious). People with norovirus are contagious from the moment they begin feeling ill. They may remain contagious for as long as 3 days to 2 weeks after recovery. People can become infected with the virus in several ways. This includes:  Eating food or drinking liquids that are contaminated with norovirus.  Touching surfaces or objects contaminated with norovirus, and then placing your hand in your mouth.  Having direct contact with a person who is infected and shows symptoms. This may occur while caring for someone with illness or while sharing foods or eating utensils with someone who is ill. SYMPTOMS  Symptoms usually begin 1 to 2 days after ingestion of the virus. Symptoms may include:  Nausea.  Vomiting.  Diarrhea.  Stomach cramps.  Low-grade fever.  Chills.  Headache.  Muscle aches.  Tiredness. Most people with norovirus illness get better within 1 to 2 days. Some people become dehydrated because they cannot drink enough liquids to replace those lost from vomiting and diarrhea. This is especially true for young children, the elderly, and others who are unable to care for themselves. DIAGNOSIS  Diagnosis is based on your symptoms and exam. Currently, only state public health laboratories have the ability to test for norovirus in stool or vomit. TREATMENT  No specific treatment exists for norovirus infections. No vaccine is  available to prevent infections. Norovirus illness is usually brief in healthy people. If you are ill with vomiting and diarrhea, you should drink enough water and fluids to keep your urine clear or pale yellow. Dehydration is the most serious health effect that can result from this infection. By drinking oral rehydration solution (ORS), people can reduce their chance of becoming dehydrated. There are many commercially available pre-made and powdered ORS designed to safely rehydrate people. These may be recommended by your caregiver. Replace any new fluid losses from diarrhea or vomiting with ORS as follows:  If your child weighs 10 kg or less (22 lb or less), give 60 to 120 ml ( to  cup or 2 to 4 oz) of ORS for each diarrheal stool or vomiting episode.  If your child weighs more than 10 kg (more than 22 lb), give 120 to 240 ml ( to 1 cup or 4 to 8 oz) of ORS for each diarrheal stool or vomiting episode. HOME CARE INSTRUCTIONS   Follow all your caregiver's instructions.  Avoid sugar-free and alcoholic drinks while ill.  Only take over-the-counter or prescription medicines for pain, vomiting, diarrhea, or fever as directed by your caregiver. You can decrease your chances of coming in contact with norovirus or spreading it by following these steps:  Frequently wash your hands,  especially after using the toilet, changing diapers, and before eating or preparing food.  Carefully wash fruits and vegetables. Cook shellfish before eating them.  Do not prepare food for others while you are infected and for at least 3 days after recovering from illness.  Thoroughly clean and disinfect contaminated surfaces immediately after an episode of illness using a bleach-based household cleaner.  Immediately remove and wash clothing or linens that may be contaminated with the virus.  Use the toilet to dispose of any vomit or stool. Make sure the surrounding area is kept clean.  Food that may have been  contaminated by an ill person should be discarded. SEEK IMMEDIATE MEDICAL CARE IF:   You develop symptoms of dehydration that do not improve with fluid replacement. This may include:  Excessive sleepiness.  Lack of tears.  Dry mouth.  Dizziness when standing.  Weak pulse. Document Released: 07/29/2002 Document Revised: 07/31/2011 Document Reviewed: 08/30/2009 [facility] CfExitCare Patient Information 2015 [place] City[place], [place]. This information is not intended to replace advice given to you by your health care provider. Make sure you discuss any questions you have with your health care provider.

## 2014-05-14 NOTE — ED Notes (Signed)
Pt reports that she started having v/d this morning with blood in her stool. Reports abd pain and back pain.

## 2014-05-14 NOTE — ED Provider Notes (Signed)
CSN: [id number]     Arrival date & time 05/14/14  1044 History   First MD Initiated Contact with Patient 05/14/14 1219     Chief Complaint  Patient presents with  . Vomiting  . Diarrhea  . Abdominal Pain     (Consider location/radiation/quality/duration/timing/severity/associated sxs/prior Treatment) HPI Tammy Carlson is a 38 y.o. female with hx of appendectomy, intercritical cystitis, kidney stone, presents to ED with complaint of abdominal pain, nausea, vomiting, bloody diarrhea onset this morning around 6am. Multiple episodes of watery diarrhea with bright red blood. Multiple episodes of vomiting. Pt feeling weak, dizzy.  No medications taken prior to coming in. Denies recent travel. No contact with any ill. No one with same symptoms. No hx of gi bleed in the past  Past Medical History  Diagnosis Date  . Kidney stone    Past Surgical History  Procedure Laterality Date  . Cesarean section    . Appendectomy     History reviewed. No pertinent family history. History  Substance Use Topics  . Smoking status: Current Every Day Smoker  . Smokeless tobacco: Not on file  . Alcohol Use: No   OB History    No data available     Review of Systems  Constitutional: Positive for fatigue. Negative for fever and chills.  Respiratory: Negative for cough, chest tightness and shortness of breath.   Cardiovascular: Negative for chest pain, palpitations and leg swelling.  Gastrointestinal: Positive for nausea, vomiting, abdominal pain, diarrhea and blood in stool.  Genitourinary: Negative for dysuria, flank pain, vaginal bleeding and pelvic pain.  Musculoskeletal: Negative for myalgias, arthralgias, neck pain and neck stiffness.  Skin: Negative for rash.  Neurological: Positive for dizziness, weakness and light-headedness. Negative for headaches.  All other systems reviewed and are negative.     Allergies  Review of patient's allergies indicates no known allergies.  Home  Medications   Prior to Admission medications   Not on File   BP 99/64 mmHg  Pulse 99  Temp(Src) 97.6 F (36.4 C) (Oral)  Resp 20  SpO2 98% Physical Exam  Constitutional: She is oriented to person, place, and time. She appears well-developed and well-nourished.  Uncomfortable appearing  HENT:  Head: Normocephalic.  Oral mucosa dry  Eyes: Conjunctivae are normal.  Neck: Neck supple.  Cardiovascular: Normal rate, regular rhythm and normal heart sounds.   Pulmonary/Chest: Effort normal and breath sounds normal. No respiratory distress. She has no wheezes. She has no rales.  Abdominal: Soft. Bowel sounds are normal. She exhibits no distension. There is tenderness. There is no rebound.  Diffuse tenderness  Genitourinary:  Rectum normal, no stool in rectum. No bright red blood  Musculoskeletal: She exhibits no edema.  Neurological: She is alert and oriented to person, place, and time.  Skin: Skin is warm and dry.  Psychiatric: She has a normal mood and affect. Her behavior is normal.  Nursing note and vitals reviewed.   ED Course  Procedures (including critical care time) Labs Review Labs Reviewed  CBC WITH DIFFERENTIAL - Abnormal; Notable for the following:    WBC 14.3 (*)    RBC 5.99 (*)    Hemoglobin 17.5 (*)    HCT 51.3 (*)    Neutrophils Relative % 92 (*)    Neutro Abs 13.3 (*)    Lymphocytes Relative 5 (*)    All other components within normal limits  COMPREHENSIVE METABOLIC PANEL - Abnormal; Notable for the following:    Glucose, Bld 185 (*)  GFR calc non Af Amer 78 (*)    GFR calc Af Amer 90 (*)    All other components within normal limits  LIPASE, BLOOD  URINALYSIS, ROUTINE W REFLEX MICROSCOPIC  I-STAT CG4 LACTIC ACID, ED  POC URINE PREG, ED    Imaging Review No results found.   EKG Interpretation None      MDM   Final diagnoses:  Abdominal pain  Nausea vomiting and diarrhea    Pt with acute onset of nausea, vomiting, bloody diarrhea,  abdominal cramping. She is afebrile, tachcyardic, appears dry. No recent antibiotics, no recent travel. No contact with nursing home/elderly, no hospital stay. Abdomen is soft, diffusely tender, mainly in upper abdomen.  Will get labs, CT abd/pelvis  4:00 PM Pt feeling better with IV dilaudid. She received 3 L of IV fluids. No diarrhea or vomiting in ED. Hemoccult positive. VS normal. CT abd/pelvis pending.   5:01 PM Head CT is negative for acute process. Incidental finding of left adrenal nodule, and 2.8 cm left ovarian cyst. No left adnexal tenderness. Patient continues to have some pain, nausea much improved. Will try a by mouth trial. Discussed with Dr. Rennis ChrisJacobowitz, most likely viral gastroenteritis. Discussed return precautions. Close follow with her primary care doctor.  Filed Vitals:   05/14/14 1448 05/14/14 1500 05/14/14 1533 05/14/14 1600  BP: 101/51 104/84 107/51 111/62  Pulse: 96 99 97 97  Temp:      TempSrc:      Resp: 15   16  SpO2: 99% 99% 99% 99%     Lottie Musselatyana A Zyon Grout, PA-C 05/14/14 1832  Doug SouSam Jacubowitz, MD 05/14/14 1926

## 2014-05-14 NOTE — ED Notes (Signed)
Report to gabe, rn.  Pt care transferred 

## 2015-06-28 ENCOUNTER — Encounter (HOSPITAL_COMMUNITY): Payer: Self-pay | Admitting: Emergency Medicine

## 2015-06-28 DIAGNOSIS — Z87440 Personal history of urinary (tract) infections: Secondary | ICD-10-CM | POA: Insufficient documentation

## 2015-06-28 DIAGNOSIS — Z3202 Encounter for pregnancy test, result negative: Secondary | ICD-10-CM | POA: Insufficient documentation

## 2015-06-28 DIAGNOSIS — Z792 Long term (current) use of antibiotics: Secondary | ICD-10-CM | POA: Diagnosis not present

## 2015-06-28 DIAGNOSIS — N390 Urinary tract infection, site not specified: Secondary | ICD-10-CM | POA: Insufficient documentation

## 2015-06-28 DIAGNOSIS — F17210 Nicotine dependence, cigarettes, uncomplicated: Secondary | ICD-10-CM | POA: Diagnosis not present

## 2015-06-28 DIAGNOSIS — Z87442 Personal history of urinary calculi: Secondary | ICD-10-CM | POA: Diagnosis not present

## 2015-06-28 DIAGNOSIS — Z87448 Personal history of other diseases of urinary system: Secondary | ICD-10-CM | POA: Insufficient documentation

## 2015-06-28 DIAGNOSIS — Z79899 Other long term (current) drug therapy: Secondary | ICD-10-CM | POA: Insufficient documentation

## 2015-06-28 DIAGNOSIS — R109 Unspecified abdominal pain: Secondary | ICD-10-CM | POA: Diagnosis present

## 2015-06-28 LAB — COMPREHENSIVE METABOLIC PANEL
ALT: 18 U/L (ref 14–54)
AST: 22 U/L (ref 15–41)
Albumin: 4.1 g/dL (ref 3.5–5.0)
Alkaline Phosphatase: 50 U/L (ref 38–126)
Anion gap: 13 (ref 5–15)
BILIRUBIN TOTAL: 0.4 mg/dL (ref 0.3–1.2)
BUN: 10 mg/dL (ref 6–20)
CHLORIDE: 98 mmol/L — AB (ref 101–111)
CO2: 23 mmol/L (ref 22–32)
Calcium: 9.2 mg/dL (ref 8.9–10.3)
Creatinine, Ser: 0.9 mg/dL (ref 0.44–1.00)
GFR calc Af Amer: >60 mL/min (ref >60)
Glucose, Bld: 86 mg/dL (ref 65–99)
Potassium: 3.7 mmol/L (ref 3.5–5.1)
Sodium: 134 mmol/L — ABNORMAL LOW (ref 135–145)
TOTAL PROTEIN: 7.6 g/dL (ref 6.5–8.1)

## 2015-06-28 LAB — URINALYSIS, ROUTINE W REFLEX MICROSCOPIC
Bilirubin Urine: NEGATIVE
GLUCOSE, UA: NEGATIVE mg/dL
KETONES UR: 15 mg/dL — AB
LEUKOCYTES UA: NEGATIVE
Nitrite: NEGATIVE
PROTEIN: NEGATIVE mg/dL
Specific Gravity, Urine: 1.022 (ref 1.005–1.030)
pH: 5.5 (ref 5.0–8.0)

## 2015-06-28 LAB — CBC
HCT: 44.6 % (ref 36.0–46.0)
Hemoglobin: 15.1 g/dL — ABNORMAL HIGH (ref 12.0–15.0)
MCH: 29.4 pg (ref 26.0–34.0)
MCHC: 33.9 g/dL (ref 30.0–36.0)
MCV: 86.8 fL (ref 78.0–100.0)
PLATELETS: 235 10*3/uL (ref 150–400)
RBC: 5.14 MIL/uL — ABNORMAL HIGH (ref 3.87–5.11)
RDW: 12.7 % (ref 11.5–15.5)
WBC: 8.9 10*3/uL (ref 4.0–10.5)

## 2015-06-28 LAB — URINE MICROSCOPIC-ADD ON

## 2015-06-28 LAB — POC URINE PREG, ED: Preg Test, Ur: NEGATIVE

## 2015-06-28 MED ORDER — FENTANYL CITRATE (PF) 100 MCG/2ML IJ SOLN
50.0000 ug | Freq: Once | INTRAMUSCULAR | Status: AC
Start: 2015-06-28 — End: 2015-06-28
  Administered 2015-06-28: 50 ug via NASAL

## 2015-06-28 MED ORDER — FENTANYL CITRATE (PF) 100 MCG/2ML IJ SOLN
INTRAMUSCULAR | Status: AC
  Filled 2015-06-28: qty 2

## 2015-06-28 NOTE — ED Notes (Signed)
Pt. reports right flank pain with hematuria , chills , diaphoresis and nausea onset last week , currently taking Cipro antibiotic diagnosed with pyelonephritis .

## 2015-06-29 ENCOUNTER — Emergency Department (HOSPITAL_COMMUNITY): Payer: Medicaid Other

## 2015-06-29 ENCOUNTER — Encounter (HOSPITAL_COMMUNITY): Payer: Self-pay | Admitting: Radiology

## 2015-06-29 ENCOUNTER — Emergency Department (HOSPITAL_COMMUNITY)
Admission: EM | Admit: 2015-06-29 | Discharge: 2015-06-29 | Disposition: A | Discharge status: Home / Self Care | Payer: Medicaid Other | Attending: Emergency Medicine | Admitting: Emergency Medicine

## 2015-06-29 DIAGNOSIS — N390 Urinary tract infection, site not specified: Secondary | ICD-10-CM

## 2015-06-29 DIAGNOSIS — R109 Unspecified abdominal pain: Secondary | ICD-10-CM

## 2015-06-29 HISTORY — DX: Urinary tract infection, site not specified: N39.0

## 2015-06-29 HISTORY — DX: Interstitial cystitis (chronic) without hematuria: N30.10

## 2015-06-29 HISTORY — DX: Tubulo-interstitial nephritis, not specified as acute or chronic: N12

## 2015-06-29 MED ORDER — ONDANSETRON HCL 4 MG/2ML IJ SOLN
4.0000 mg | Freq: Once | INTRAMUSCULAR | Status: AC
  Administered 2015-06-29: 4 mg via INTRAVENOUS
  Filled 2015-06-29: qty 2

## 2015-06-29 MED ORDER — SODIUM CHLORIDE 0.9 % IV BOLUS (SEPSIS)
1000.0000 mL | Freq: Once | INTRAVENOUS | Status: AC
  Administered 2015-06-29: 1000 mL via INTRAVENOUS

## 2015-06-29 MED ORDER — MORPHINE SULFATE (PF) 4 MG/ML IV SOLN
4.0000 mg | Freq: Once | INTRAVENOUS | Status: AC
  Administered 2015-06-29: 4 mg via INTRAVENOUS
  Filled 2015-06-29: qty 1

## 2015-06-29 MED ORDER — OXYCODONE-ACETAMINOPHEN 5-325 MG PO TABS: 1.0000 | ORAL_TABLET | Freq: Four times a day (QID) | ORAL | Status: AC | PRN

## 2015-06-29 NOTE — ED Notes (Signed)
Provided patient with Sprite and crackers, tolerating well at this time. Denies N/V.

## 2015-06-29 NOTE — ED Notes (Signed)
Patient transported to CT 

## 2015-06-29 NOTE — ED Notes (Signed)
Patient verbalized understanding of discharge instructions and denies any further needs or questions at this time. VS stable. Patient ambulatory with steady gait.  

## 2015-06-29 NOTE — ED Notes (Signed)
MD at bedside. 

## 2015-06-29 NOTE — Discharge Instructions (Signed)
You were seen today for flank pain and urinary symptoms. You should continue antibiotics as prescribed by her GYN. There is no evidence of kidney stone or kidney infection on her CT scan. If you develop nausea, vomiting, fevers, any new or worsening symptoms she should be reevaluated.  Flank Pain Flank pain refers to pain that is located on the side of the body between the upper abdomen and the back. The pain may occur over a short period of time (acute) or may be long-term or reoccurring (chronic). It may be mild or severe. Flank pain can be caused by many things. CAUSES  Some of the more common causes of flank pain include:  Muscle strains.   Muscle spasms.   A disease of your spine (vertebral disk disease).   A lung infection (pneumonia).   Fluid around your lungs (pulmonary edema).   A kidney infection.   Kidney stones.   A very painful skin rash caused by the chickenpox virus (shingles).   Gallbladder disease.  HOME CARE INSTRUCTIONS  Home care will depend on the cause of your pain. In general,  Rest as directed by your caregiver.  Drink enough fluids to keep your urine clear or pale yellow.  Only take over-the-counter or prescription medicines as directed by your caregiver. Some medicines may help relieve the pain.  Tell your caregiver about any changes in your pain.  Follow up with your caregiver as directed. SEEK IMMEDIATE MEDICAL CARE IF:   Your pain is not controlled with medicine.   You have new or worsening symptoms.  Your pain increases.   You have abdominal pain.   You have shortness of breath.   You have persistent nausea or vomiting.   You have swelling in your abdomen.   You feel faint or pass out.   You have blood in your urine.  You have a fever or persistent symptoms for more than 2-3 days.  You have a fever and your symptoms suddenly get worse. MAKE SURE YOU:   Understand these instructions.  Will watch your  condition.  Will get help right away if you are not doing well or get worse.   This information is not intended to replace advice given to you by your health care provider. Make sure you discuss any questions you have with your health care provider.   Document Released: 06/29/2005 Document Revised: 01/31/2012 Document Reviewed: 12/21/2011 [employer] Interactive Patient Education [employer]

## 2015-06-29 NOTE — ED Provider Notes (Signed)
CSN: [id number]     Arrival date & time 06/28/15  2234 History  By signing my name below, I, Rohini Rajnarayanan, attest that this documentation has been prepared under the direction and in the presence of Shon Baton, MD Electronically Signed: Charlean Merl, ED Scribe 06/29/2015 at 3:43 AM.    Chief Complaint  Patient presents with  . Flank Pain    The history is provided by the patient. No language interpreter was used.  HPI Comments: Tammy Carlson is a 40 y.o. female with a pmhx of interstitial cyctitis and endometriosis, with a recent occurrence of pyelonephritis, who presents to the Emergency Department complaining of gradual onset, constant, persistent, 10/10 right flank pain that has now begun radiating to the left flank. Pt also reports associated hematuria, chills, difficulty urinating, diaphoresis, and nausea which began last week. Pt had a subjective fever over the weekend.  Pt recently saw her OB/GYN for the pain, and was initially prescribed amoxicillin for UTI. This prescription was later changed to Cipro yesterday after presumably culture results grew out speciation's. I do not have this information. Patient does report a history of kidney stones previously.  Past Medical History  Diagnosis Date  . Kidney stone   . UTI (urinary tract infection)   . Pyelonephritis   . Interstitial cystitis    Past Surgical History  Procedure Laterality Date  . Cesarean section    . Appendectomy     No family history on file. Social History  Substance Use Topics  . Smoking status: Current Every Day Smoker  . Smokeless tobacco: None  . Alcohol Use: No   OB History    No data available     Review of Systems  Constitutional: Positive for fever, chills and diaphoresis.  Respiratory: Negative for chest tightness and shortness of breath.   Gastrointestinal: Positive for nausea.  Genitourinary: Positive for hematuria, flank pain and difficulty urinating.  All other systems  reviewed and are negative.     Allergies  Review of patient's allergies indicates no known allergies.  Home Medications   Prior to Admission medications   Medication Sig Start Date End Date Taking? Authorizing Provider  amitriptyline (ELAVIL) 50 MG tablet Take 100 mg by mouth at bedtime.  04/26/14  Yes Historical Provider, MD  CAMRESE 0.15-0.03 &0.01 MG tablet Take 1 tablet by mouth daily.  04/27/14  Yes Historical Provider, MD  ciprofloxacin (CIPRO) 250 MG tablet Take 250 mg by mouth daily with breakfast.   Yes Historical Provider, MD  ELMIRON 100 MG capsule Take 200 mg by mouth 2 (two) times daily. 04/26/14  Yes Historical Provider, MD  hydrOXYzine (VISTARIL) 25 MG capsule Take 50 mg by mouth at bedtime.  04/26/14  Yes Historical Provider, MD  Multiple Vitamin (MULTIVITAMIN WITH MINERALS) TABS tablet Take 1 tablet by mouth daily.   Yes Historical Provider, MD  saccharomyces boulardii (FLORASTOR) 250 MG capsule Take 250 mg by mouth at bedtime.   Yes Historical Provider, MD  oxyCODONE-acetaminophen (PERCOCET/ROXICET) 5-325 MG tablet Take 1 tablet by mouth every 6 (six) hours as needed for severe pain. 06/29/15   Shon Baton, MD   BP 116/87 mmHg  Pulse 104  Temp(Src) 98.7 F (37.1 C) (Oral)  Resp 18  Wt 122 lb 4 oz (55.452 kg)  SpO2 99% Physical Exam  Constitutional: She is oriented to person, place, and time. She appears well-developed and well-nourished. No distress.  Uncomfortable appearing  HENT:  Head: Normocephalic and atraumatic.  Cardiovascular: Normal rate,  regular rhythm and normal heart sounds.   No murmur heard. Pulmonary/Chest: Effort normal. No respiratory distress. She has wheezes.  Abdominal: Soft. Bowel sounds are normal. There is no tenderness. There is no rebound and no guarding.  Genitourinary:  No CVA tenderness  Neurological: She is alert and oriented to person, place, and time.  Skin: Skin is warm and dry.  Psychiatric: She has a normal mood and affect.   Nursing note and vitals reviewed.   ED Course  Procedures  DIAGNOSTIC STUDIES: Oxygen Saturation is 99% on RA, normal by my interpretation.    COORDINATION OF CARE:  3:11 AM-Discussed treatment plan which includes morphine, fentanyl, blood work, urinalysis, and a CT renal stone study,  with pt at bedside and pt agreed to plan.    Labs Review Labs Reviewed  COMPREHENSIVE METABOLIC PANEL - Abnormal; Notable for the following:    Sodium 134 (*)    Chloride 98 (*)    All other components within normal limits  CBC - Abnormal; Notable for the following:    RBC 5.14 (*)    Hemoglobin 15.1 (*)    All other components within normal limits  URINALYSIS, ROUTINE W REFLEX MICROSCOPIC (NOT AT [facility]) - Abnormal; Notable for the following:    APPearance CLOUDY (*)    Hgb urine dipstick TRACE (*)    Ketones, ur 15 (*)    All other components within normal limits  URINE MICROSCOPIC-ADD ON - Abnormal; Notable for the following:    Squamous Epithelial / LPF 0-5 (*)    Bacteria, UA MANY (*)    All other components within normal limits  POC URINE PREG, ED    Imaging Review Ct Renal Stone Study  06/29/2015  CLINICAL DATA:  Acute onset of right flank pain, hematuria, chills, diaphoresis and nausea. Initial encounter. EXAM: CT ABDOMEN AND PELVIS WITHOUT CONTRAST TECHNIQUE: Multidetector CT imaging of the abdomen and pelvis was performed following the standard protocol without IV contrast. COMPARISON:  CT of the abdomen and pelvis performed 05/14/2014 FINDINGS: The visualized lung bases are clear. The liver and spleen are unremarkable in appearance. The gallbladder is within normal limits. The pancreas and right adrenal gland are unremarkable. A 1.7 cm left adrenal nodule demonstrates low attenuation and likely reflects an adrenal adenoma. The kidneys are unremarkable in appearance. There is no evidence of hydronephrosis. No renal or ureteral stones are seen. No perinephric stranding is appreciated. No  free fluid is identified. The small bowel is unremarkable in appearance. The stomach is within normal limits. No acute vascular abnormalities are seen. The patient is status post appendectomy. The colon is grossly unremarkable in appearance. The bladder is mildly distended and grossly unremarkable. The uterus is unremarkable. The ovaries are relatively symmetric, aside from a 3.1 cm left adnexal cyst. No inguinal lymphadenopathy is seen. No acute osseous abnormalities are identified. IMPRESSION: 1. No acute abnormality seen to explain the patient's symptoms. 2. 3.1 cm left adnexal cystic focus is likely physiologic, given the patient's age, and has changed only minimally in size from 2015. 3. 1.7 cm left adrenal adenoma again noted. Electronically Signed   By: Roanna Raider M.D.   On: 06/29/2015 04:28   I have personally reviewed and evaluated these images and lab results as part of my medical decision-making.   EKG Interpretation None      MDM   Final diagnoses:  Flank pain  UTI (lower urinary tract infection)   Patient presents with flank pain. Recent diagnosis of UTI and  recent change of antibiotics. Patient reports fever over the weekend. Currently afebrile. Does also have a history of kidney stones. Uncomfortable appearing but exam is otherwise unremarkable. Vital signs reassuring. Patient was given pain and nausea medication as well as fluids.  Lab work with evidence of mild dehydration.  Urine with 0-5 white cells and many bacteria. Do not fill culture would be beneficial given patient's recent antibiotic use.  It sounds as though the patient has a current culture at her OB/GYN office. CT scan renal stone study negative for kidney stone. Otherwise there is not appear to be any perinephric stranding. Given that she is well-appearing and without fever here, feel that she most likely has a lower urinary tract infection. Continue ciprofloxacin at home.  If she develops fevers or any new or  worsening symptoms she needs to be reevaluated.  After history, exam, and medical workup I feel the patient has been appropriately medically screened and is safe for discharge home. Pertinent diagnoses were discussed with the patient. Patient was given return precautions.  I personally performed the services described in this documentation, which was scribed in my presence. The recorded information has been reviewed and is accurate.      Shon Baton, MD 06/29/15 [phone]

## 2015-06-29 NOTE — ED Notes (Signed)
MD to see and assess patient before RN assessment. See MD note.

## 2016-07-01 ENCOUNTER — Emergency Department (HOSPITAL_COMMUNITY): Payer: Medicaid Other

## 2016-07-01 ENCOUNTER — Emergency Department (HOSPITAL_COMMUNITY)
Admission: EM | Admit: 2016-07-01 | Discharge: 2016-07-01 | Disposition: A | Discharge status: Home / Self Care | Payer: Medicaid Other | Attending: Emergency Medicine | Admitting: Emergency Medicine

## 2016-07-01 ENCOUNTER — Encounter (HOSPITAL_COMMUNITY): Payer: Self-pay | Admitting: Emergency Medicine

## 2016-07-01 DIAGNOSIS — W228XXA Striking against or struck by other objects, initial encounter: Secondary | ICD-10-CM | POA: Diagnosis not present

## 2016-07-01 DIAGNOSIS — Y929 Unspecified place or not applicable: Secondary | ICD-10-CM | POA: Insufficient documentation

## 2016-07-01 DIAGNOSIS — S99922A Unspecified injury of left foot, initial encounter: Secondary | ICD-10-CM | POA: Diagnosis present

## 2016-07-01 DIAGNOSIS — Y999 Unspecified external cause status: Secondary | ICD-10-CM | POA: Diagnosis not present

## 2016-07-01 DIAGNOSIS — M79675 Pain in left toe(s): Secondary | ICD-10-CM

## 2016-07-01 DIAGNOSIS — Y9389 Activity, other specified: Secondary | ICD-10-CM | POA: Diagnosis not present

## 2016-07-01 DIAGNOSIS — F17210 Nicotine dependence, cigarettes, uncomplicated: Secondary | ICD-10-CM | POA: Insufficient documentation

## 2016-07-01 MED ORDER — HYDROCODONE-ACETAMINOPHEN 5-325 MG PO TABS
2.0000 | ORAL_TABLET | Freq: Once | ORAL | Status: AC
  Administered 2016-07-01: 2 via ORAL
  Filled 2016-07-01: qty 2

## 2016-07-01 MED ORDER — IBUPROFEN 800 MG PO TABS
800.0000 mg | ORAL_TABLET | Freq: Once | ORAL | Status: DC
  Filled 2016-07-01: qty 1

## 2016-07-01 MED ORDER — HYDROCODONE-ACETAMINOPHEN 5-325 MG PO TABS: 1.0000 | ORAL_TABLET | Freq: Four times a day (QID) | ORAL | 0 refills | Status: AC | PRN

## 2016-07-01 NOTE — ED Triage Notes (Signed)
Patient c/o left great toe pain. Per patient kicked what she thought was a empty box at the time. Patient reports pain radiating up leg. Patient reports taking tylenol for pain this morning with no relief.

## 2016-07-01 NOTE — ED Notes (Signed)
Pt with bruising noted to bottom of left great toe after pt kicked an oven at home in frustration.  Pt states that she is not able to put weight on it and that pain radiates up left foot

## 2016-07-01 NOTE — ED Provider Notes (Signed)
[facility]-EMERGENCY DEPT Provider Note   CSN: [id number] Arrival date & time: 07/01/16  1657     History   Chief Complaint Chief Complaint  Patient presents with  . Toe Pain    HPI Tammy Carlson is a 41 y.o. female with history of interstitial cystitis who presents with left great toe pain after kicking a large solid object. Patient states she is moving and thought she was taking an empty box. Patient states this occurred last evening. Patient has had persistent pain and swelling to the toe radiates up to her foot. Patient has worsening pain with ambulation. Patient denies any numbness or tingling. Patient took Tylenol this morning without relief. Patient has been also using ice. Patient denies any other injuries. She denies any chest pain, shortness of breath, abdominal pain, nausea, vomiting.  HPI  Past Medical History:  Diagnosis Date  . Interstitial cystitis   . Kidney stone   . Pyelonephritis   . UTI (urinary tract infection)     There are no active problems to display for this patient.   Past Surgical History:  Procedure Laterality Date  . APPENDECTOMY    . CESAREAN SECTION      OB History    Gravida Para Term Preterm AB Living   3 3 2 1   2    SAB TAB Ectopic Multiple Live Births                   Home Medications    Prior to Admission medications   Medication Sig Start Date End Date Taking? Authorizing Provider  amitriptyline (ELAVIL) 50 MG tablet Take 100 mg by mouth at bedtime.  04/26/14   Historical Provider, MD  CAMRESE 0.15-0.03 &0.01 MG tablet Take 1 tablet by mouth daily.  04/27/14   Historical Provider, MD  ciprofloxacin (CIPRO) 250 MG tablet Take 250 mg by mouth daily with breakfast.    Historical Provider, MD  ELMIRON 100 MG capsule Take 200 mg by mouth 2 (two) times daily. 04/26/14   Historical Provider, MD  HYDROcodone-acetaminophen (NORCO/VICODIN) 5-325 MG tablet Take 1-2 tablets by mouth every 6 (six) hours as needed. 07/01/16   Emi Holes,  PA-C  hydrOXYzine (VISTARIL) 25 MG capsule Take 50 mg by mouth at bedtime.  04/26/14   Historical Provider, MD  Multiple Vitamin (MULTIVITAMIN WITH MINERALS) TABS tablet Take 1 tablet by mouth daily.    Historical Provider, MD  oxyCODONE-acetaminophen (PERCOCET/ROXICET) 5-325 MG tablet Take 1 tablet by mouth every 6 (six) hours as needed for severe pain. 06/29/15   Shon Baton, MD  saccharomyces boulardii (FLORASTOR) 250 MG capsule Take 250 mg by mouth at bedtime.    Historical Provider, MD    Family History History reviewed. No pertinent family history.  Social History Social History  Substance Use Topics  . Smoking status: Current Every Day Smoker    Packs/day: 0.50    Years: 20.00    Types: Cigarettes  . Smokeless tobacco: Never Used  . Alcohol use No     Allergies   Patient has no known allergies.   Review of Systems Review of Systems  Constitutional: Negative for chills and fever.  HENT: Negative for facial swelling and sore throat.   Respiratory: Negative for shortness of breath.   Cardiovascular: Negative for chest pain.  Gastrointestinal: Negative for abdominal pain, nausea and vomiting.  Genitourinary: Negative for dysuria.  Musculoskeletal: Positive for arthralgias (L great toe). Negative for back pain.  Skin: Negative for rash and  wound.  Neurological: Negative for headaches.  Psychiatric/Behavioral: The patient is not nervous/anxious.      Physical Exam Updated Vital Signs BP 140/83 (BP Location: Left Arm)   Pulse 105   Temp 98.7 F (37.1 C) (Oral)   Resp 18   Wt 57.6 kg   SpO2 97%   Physical Exam  Constitutional: She appears well-developed and well-nourished. No distress.  HENT:  Head: Normocephalic and atraumatic.  Mouth/Throat: Oropharynx is clear and moist. No oropharyngeal exudate.  Eyes: Conjunctivae are normal. Pupils are equal, round, and reactive to light. Right eye exhibits no discharge. Left eye exhibits no discharge. No scleral  icterus.  Neck: Normal range of motion. Neck supple. No thyromegaly present.  Cardiovascular: Regular rhythm, normal heart sounds and intact distal pulses.  Exam reveals no gallop and no friction rub.   No murmur heard. Pulmonary/Chest: Effort normal and breath sounds normal. No stridor. No respiratory distress. She has no wheezes. She has no rales.  Abdominal: Soft. Bowel sounds are normal. She exhibits no distension. There is no tenderness. There is no rebound and no guarding.  Musculoskeletal: She exhibits no edema.       Left foot: There is decreased range of motion, bony tenderness and swelling. There is normal capillary refill, no deformity and no laceration.       Feet:  Lymphadenopathy:    She has no cervical adenopathy.  Neurological: She is alert. Coordination normal.  Skin: Skin is warm and dry. No rash noted. She is not diaphoretic. No pallor.  Psychiatric: She has a normal mood and affect.  Nursing note and vitals reviewed.    ED Treatments / Results  Labs (all labs ordered are listed, but only abnormal results are displayed) Labs Reviewed - No data to display  EKG  EKG Interpretation None       Radiology Dg Toe Great Left  Result Date: 07/01/2016 CLINICAL DATA:  Kickboxing injury with first toe pain, initial encounter EXAM: LEFT GREAT TOE COMPARISON:  None. FINDINGS: There is no evidence of fracture or dislocation. There is no evidence of arthropathy or other focal bone abnormality. Soft tissues are unremarkable. IMPRESSION: No acute abnormality noted. Electronically Signed   By: Alcide Clever M.D.   On: 07/01/2016 18:01    Procedures Procedures (including critical care time)  Medications Ordered in ED Medications  HYDROcodone-acetaminophen (NORCO/VICODIN) 5-325 MG per tablet 2 tablet (2 tablets Oral Given 07/01/16 1839)     Initial Impression / Assessment and Plan / ED Course  I have reviewed the triage vital signs and the nursing notes.  Pertinent labs &  imaging results that were available during my care of the patient were reviewed by me and considered in my medical decision making (see chart for details).     Patient with edema and ecchymosis to left great toe. X-ray shows no acute abnormality. Patient in significant pain. Patient states she cannot take ibuprofen or tramadol due to her interstitial cystitis. Patient given Norco in the ED. Patient also discharged home with small amount of Norco. I reviewed the [place] narcotic database and found her discrepancies. Patient given hard sole shoe and crutches. Follow-up to orthopedics as needed. Return precautions discussed. Patient understands and agrees with plan. Patient vitals stable throughout ED course and discharged in satisfactory condition. Mild tachycardia most likely due to pain. I discussed patient case with Dr. Deretha Emory who guided the patient's management and agrees with plan.   Final Clinical Impressions(s) / ED Diagnoses   Final diagnoses:  Great toe pain, left    New Prescriptions Discharge Medication List as of 07/01/2016  7:03 PM    START taking these medications   Details  HYDROcodone-acetaminophen (NORCO/VICODIN) 5-325 MG tablet Take 1-2 tablets by mouth every 6 (six) hours as needed., Starting Sat 07/01/2016, Print         Emi Holeslexandra M Jemimah Cressy, PA-C 07/01/16 2024    Vanetta MuldersScott Zackowski, MD 07/03/16 [phone]

## 2016-07-01 NOTE — ED Notes (Signed)
Pt verbalized understanding of no driving and to use caution within 4 hours of taking pain meds due to meds cause drowsiness 

## 2016-07-01 NOTE — Discharge Instructions (Signed)
Medications: Norco  Treatment: Take 1-2 Norco every 4-6 hours as needed for severe pain. You can take Tylenol for mild to moderate pain, however do not take within 4 hours of taking Norco. Use ice 3-4 times daily alternating 20 minutes on, 20 minutes off. Wear hard sole shoe to ambulate.  Follow-up: Please follow-up with Dr. Romeo AppleHarrison, an orthopedic doctor, if your symptoms are not improving. Please return to the emergency department if you develop any new or worsening symptoms.

## 2016-10-13 IMAGING — CT CT RENAL STONE PROTOCOL
2 of 3 series · 15 of 46 positions shown, 17 images · non-contrast
Comparison: CT of the abdomen and pelvis performed 05/14/2014

CLINICAL DATA: Acute onset of right flank pain, hematuria, chills,
diaphoresis and nausea. Initial encounter.

EXAM:
CT ABDOMEN AND PELVIS WITHOUT CONTRAST
TECHNIQUE: Multidetector CT imaging of the abdomen and pelvis was performed
following the standard protocol without IV contrast.

[Series 2: renal stone 5mm · axial · 0.67mm/px · z∈[-472,-97]mm · 12 of 87 slices shown, 14 images]
[im 6/87  soft-tissue]
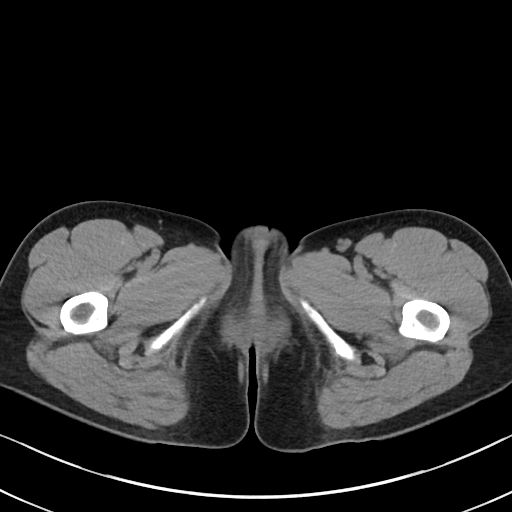
[im 6/87  bone]
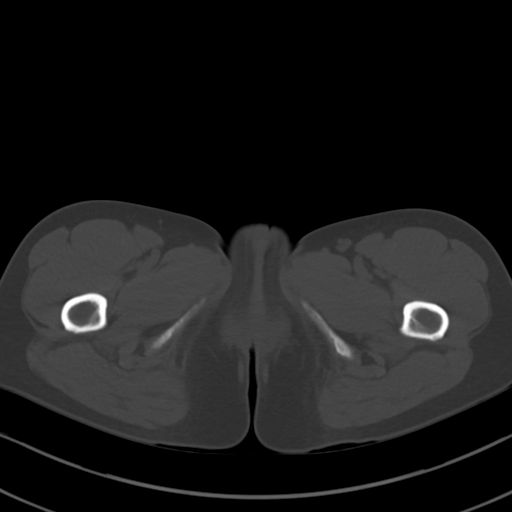
[im 12/87  soft-tissue]
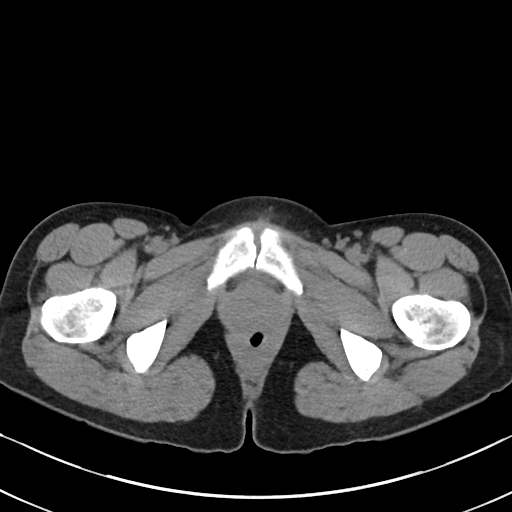
[im 20/87  soft-tissue]
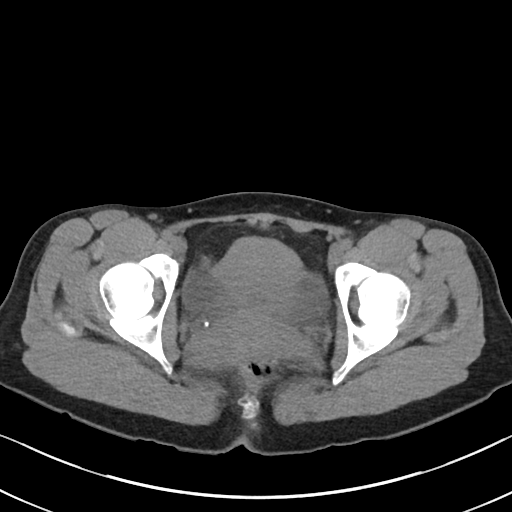
[im 25/87  soft-tissue]
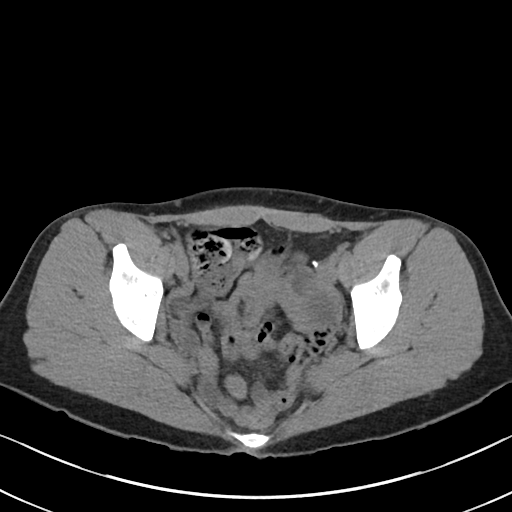
[im 34/87  soft-tissue]
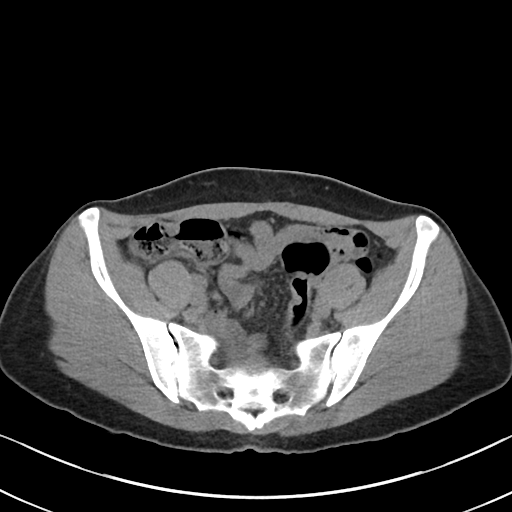
[im 39/87  soft-tissue]
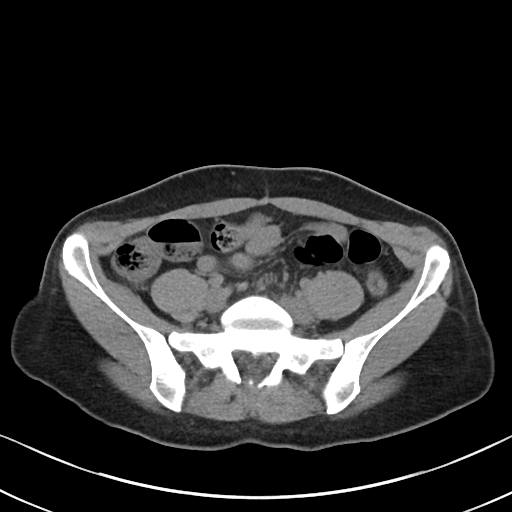
[im 48/87  soft-tissue]
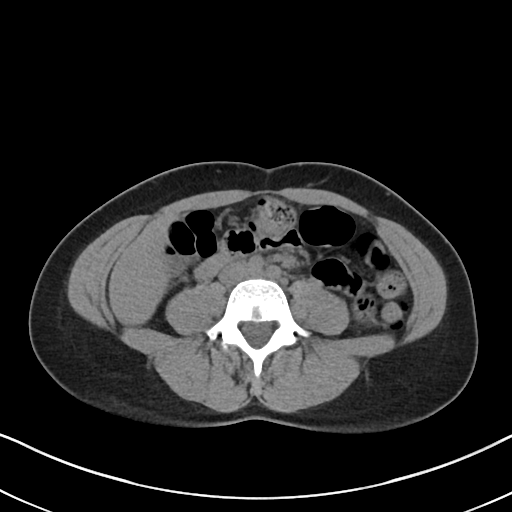
[im 53/87  soft-tissue]
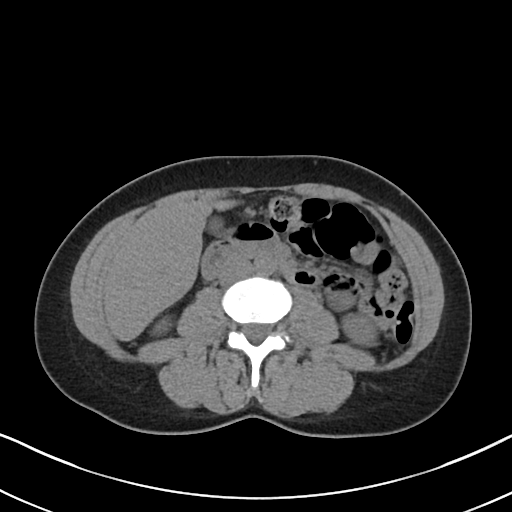
[im 62/87  soft-tissue]
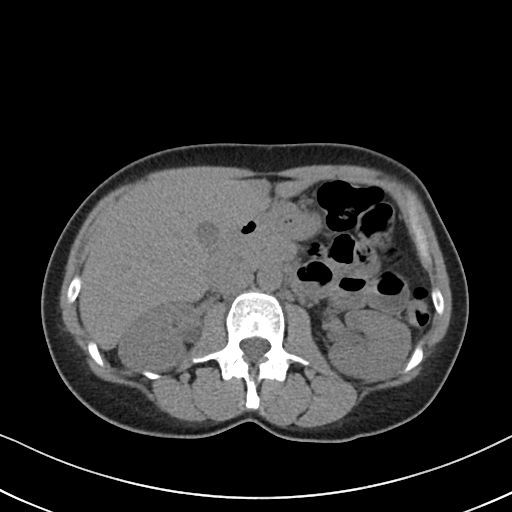
[im 62/87  bone]
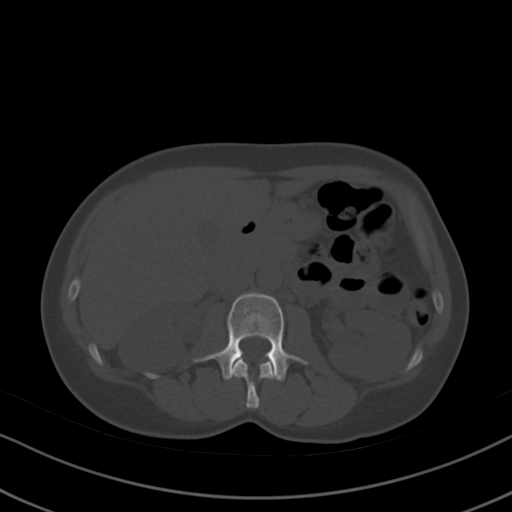
[im 67/87  soft-tissue]
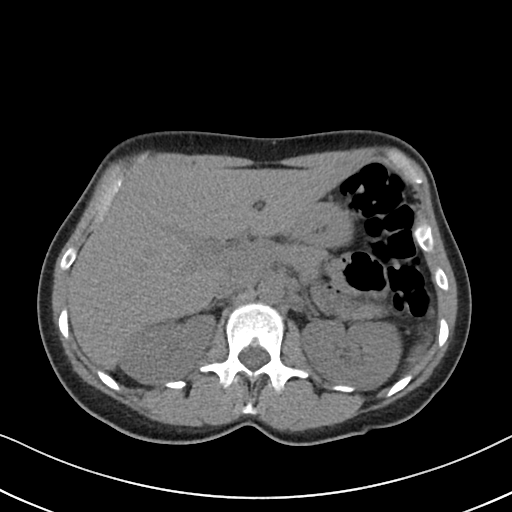
[im 75/87  soft-tissue]
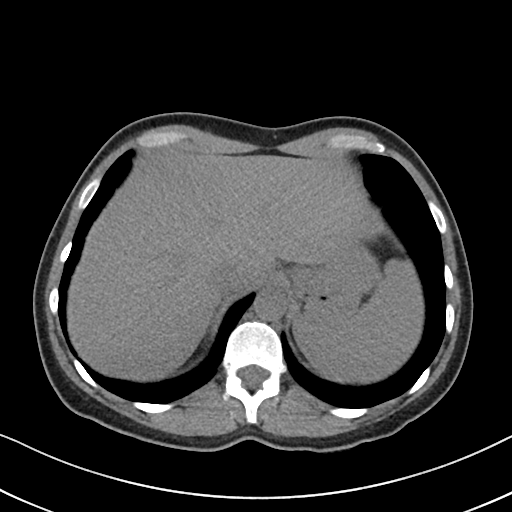
[im 81/87  soft-tissue]
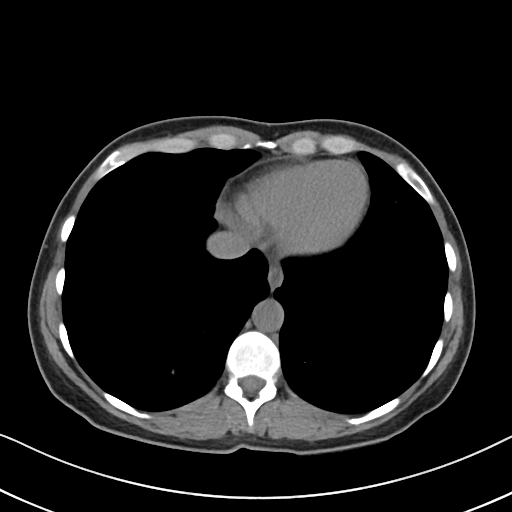

[Series 4: renal stone 3.0 cor · coronal · 0.57mm/px · 3 of 64 slices shown]
[im 22/64  soft-tissue]
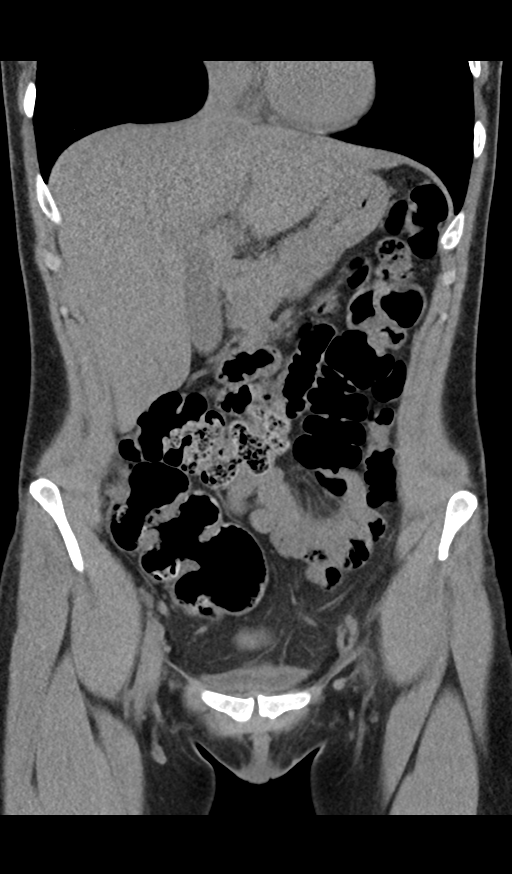
[im 29/64  soft-tissue]
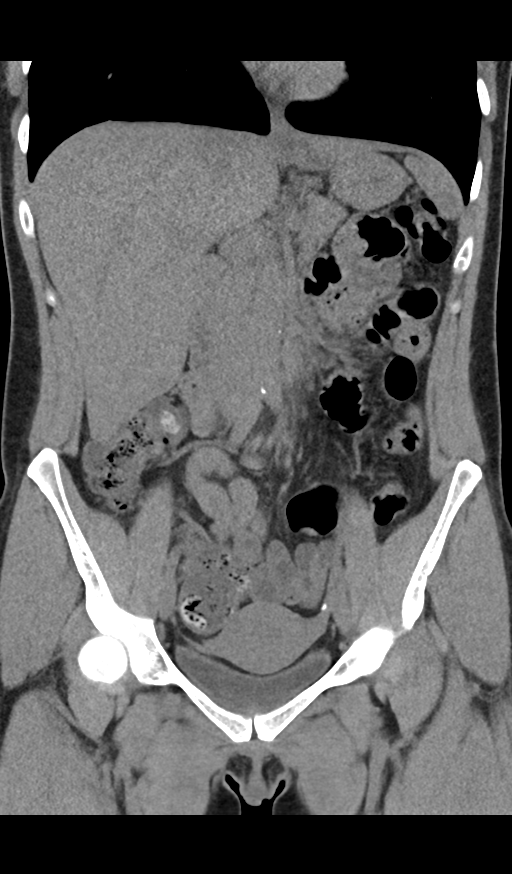
[im 36/64  soft-tissue]
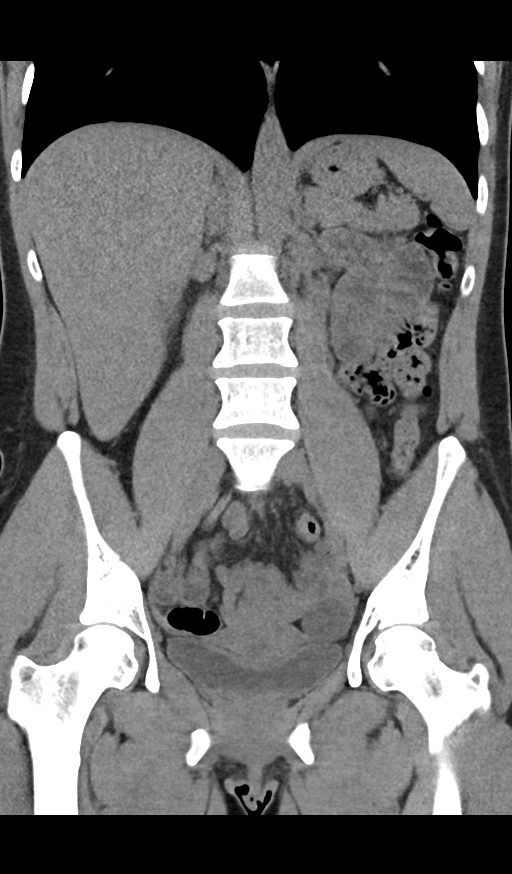

[15 of 46 positions shown; findings below may reference images not displayed]

FINDINGS: The visualized lung bases are clear.

The liver and spleen are unremarkable in appearance. The gallbladder
is within normal limits. The pancreas and right adrenal gland are
unremarkable. A 1.7 cm left adrenal nodule demonstrates low
attenuation and likely reflects an adrenal adenoma.

The kidneys are unremarkable in appearance. There is no evidence of
hydronephrosis. No renal or ureteral stones are seen. No perinephric
stranding is appreciated.

No free fluid is identified. The small bowel is unremarkable in
appearance. The stomach is within normal limits. No acute vascular
abnormalities are seen.

The patient is status post appendectomy. The colon is grossly
unremarkable in appearance.

The bladder is mildly distended and grossly unremarkable. The uterus
is unremarkable. The ovaries are relatively symmetric, aside from a
3.1 cm left adnexal cyst. No inguinal lymphadenopathy is seen.

No acute osseous abnormalities are identified.
IMPRESSION: 1. No acute abnormality seen to explain the patient's symptoms.
2. 3.1 cm left adnexal cystic focus is likely physiologic, given the
patient's age, and has changed only minimally in size from 0205.
3. 1.7 cm left adrenal adenoma again noted.

## 2017-10-16 IMAGING — DX DG TOE GREAT 2+V*L*
3 series · 3 of 3 positions shown · non-contrast
Comparison: None.

CLINICAL DATA: Kickboxing injury with first toe pain, initial
encounter

EXAM:
LEFT GREAT TOE

[toe ap]
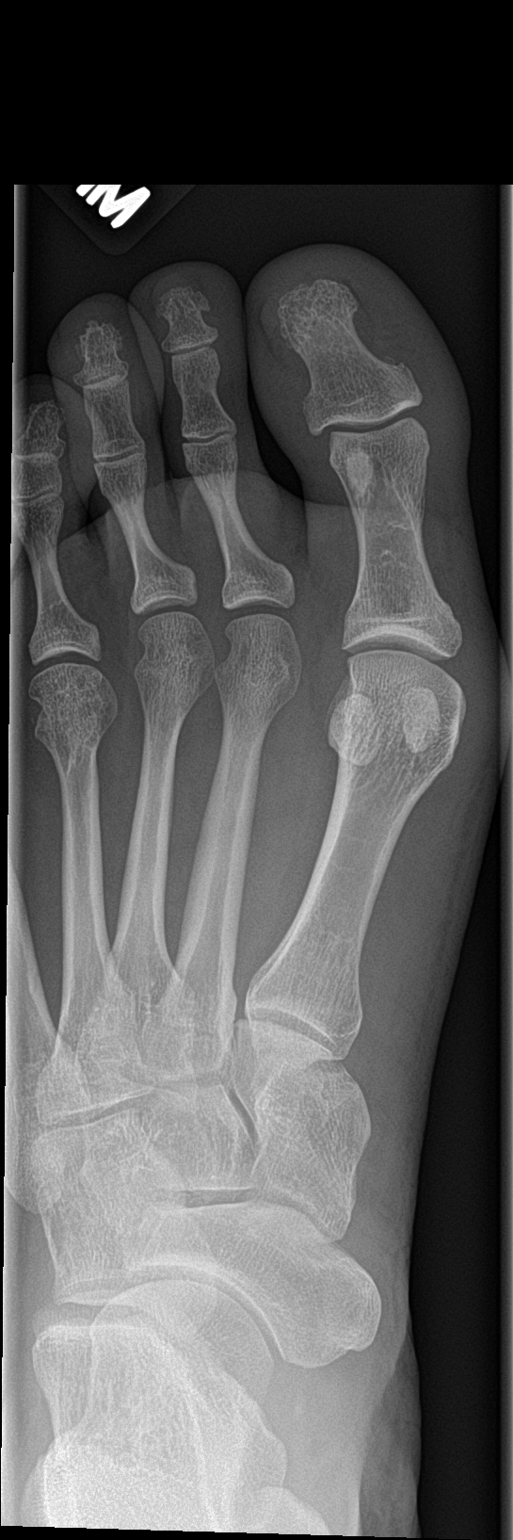

[toe obl]
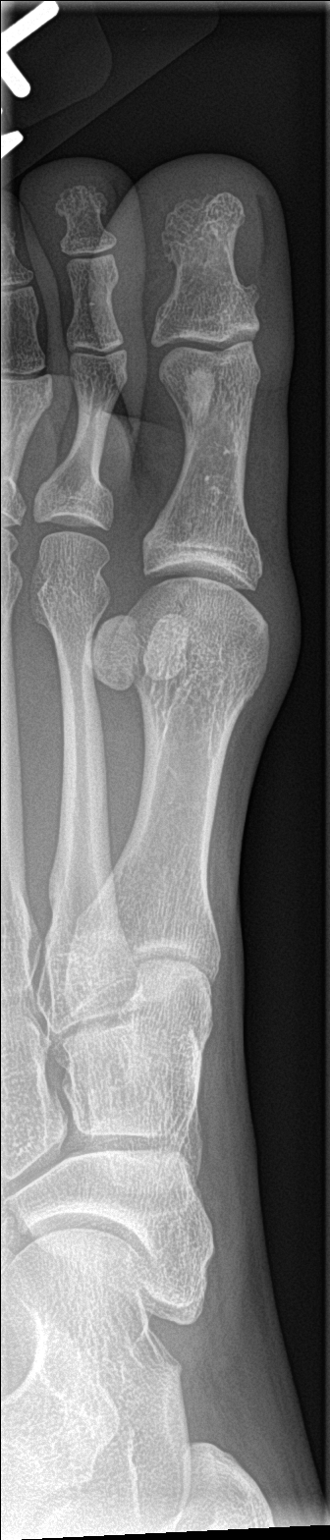

[toe lat]
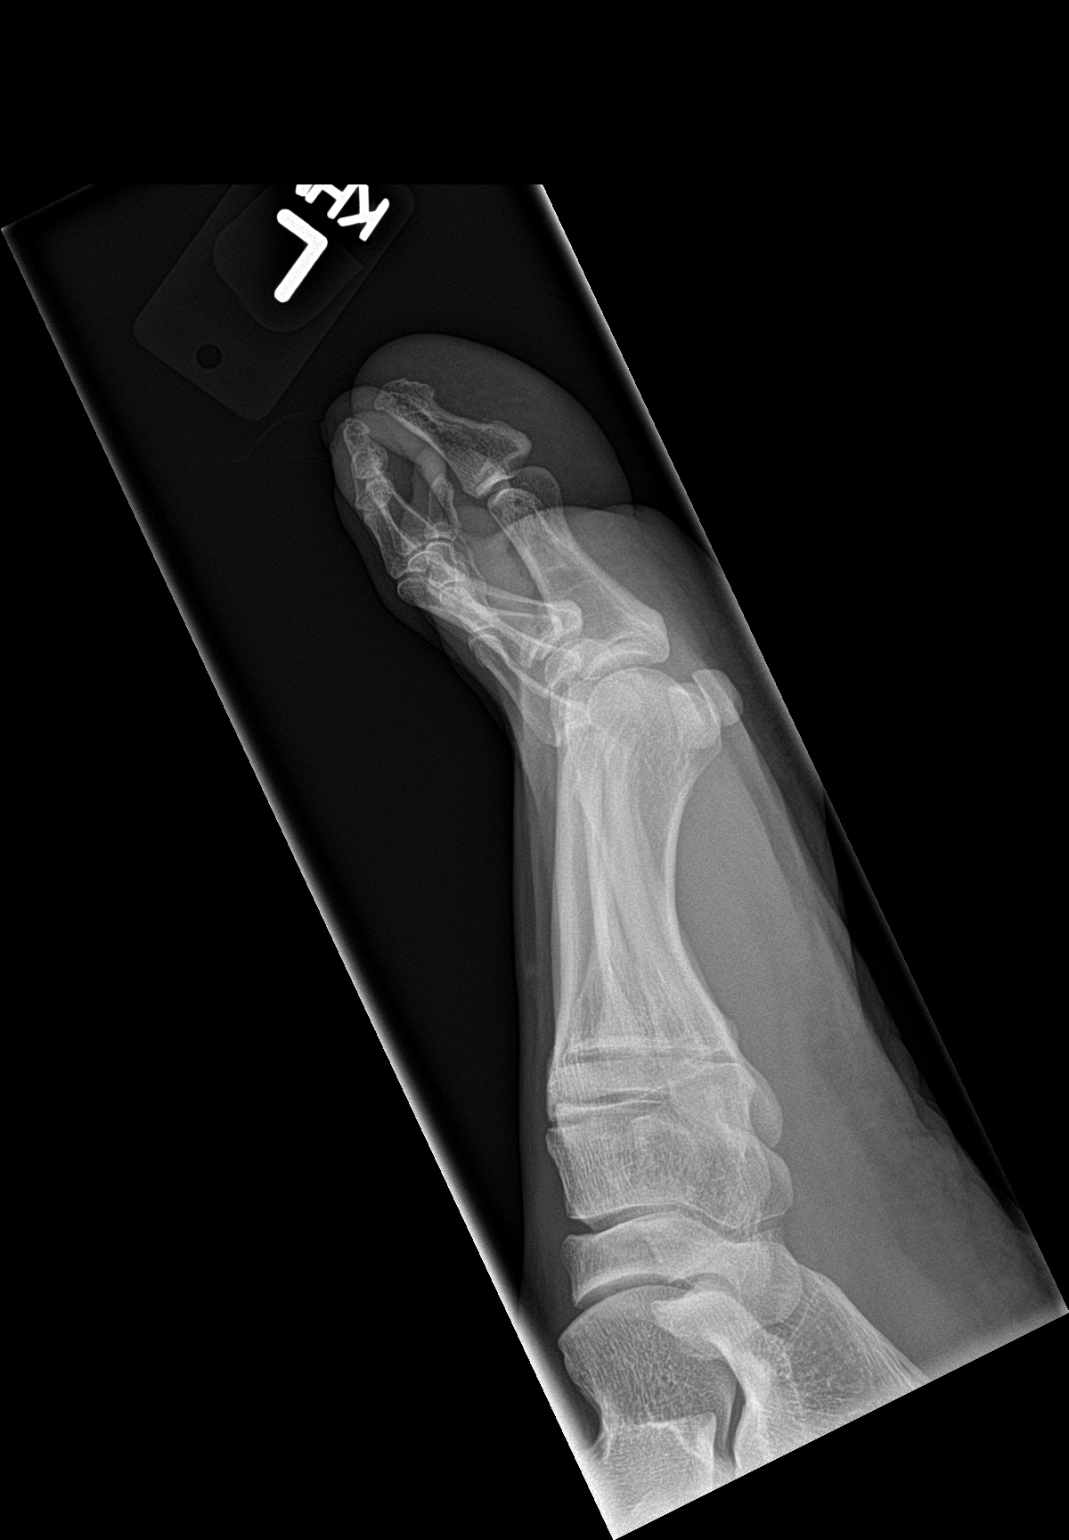

[3 of 3 positions shown; findings below may reference images not displayed]

FINDINGS: There is no evidence of fracture or dislocation. There is no
evidence of arthropathy or other focal bone abnormality. Soft
tissues are unremarkable.
IMPRESSION: No acute abnormality noted.
